# Patient Record
Sex: Female | Born: 1955 | Race: White | Hispanic: No | Marital: Married | Smoking: Never smoker
Health system: Southern US, Community
[De-identification: ages and names within clinical notes are randomized; demographics above are authoritative.]

## PROBLEM LIST (undated history)

## (undated) DIAGNOSIS — G4730 Sleep apnea, unspecified: Secondary | ICD-10-CM

## (undated) DIAGNOSIS — I10 Essential (primary) hypertension: Secondary | ICD-10-CM

## (undated) DIAGNOSIS — Z780 Asymptomatic menopausal state: Secondary | ICD-10-CM

## (undated) HISTORY — DX: Asymptomatic menopausal state: Z78.0

## (undated) HISTORY — PX: NO PAST SURGERIES: SHX2092

## (undated) HISTORY — DX: Sleep apnea, unspecified: G47.30

## (undated) HISTORY — DX: Essential (primary) hypertension: I10

---

## 2003-05-25 ENCOUNTER — Encounter: Payer: Self-pay | Admitting: Family Medicine

## 2005-04-04 ENCOUNTER — Encounter: Payer: Self-pay | Admitting: Family Medicine

## 2008-06-11 ENCOUNTER — Encounter: Payer: Self-pay | Admitting: Family Medicine

## 2008-06-25 ENCOUNTER — Encounter: Payer: Self-pay | Admitting: Family Medicine

## 2008-06-28 ENCOUNTER — Encounter: Payer: Self-pay | Admitting: Family Medicine

## 2009-08-23 LAB — CONVERTED CEMR LAB: Pap Smear: NORMAL

## 2009-09-02 ENCOUNTER — Encounter: Admission: RE | Admit: 2009-09-02 | Discharge: 2009-09-02 | Payer: Self-pay | Admitting: Obstetrics and Gynecology

## 2009-09-02 IMAGING — MG MM DIGITAL SCREENING
4 series · 4 of 4 positions shown · non-contrast
Comparison: Prior studies from [REDACTED] Assoc.

DG SCREEN MAMMOGRAM BILATERAL
Bilateral CC and MLO view(s) were taken.

DIGITAL SCREENING MAMMOGRAM WITH CAD:

[R CC]
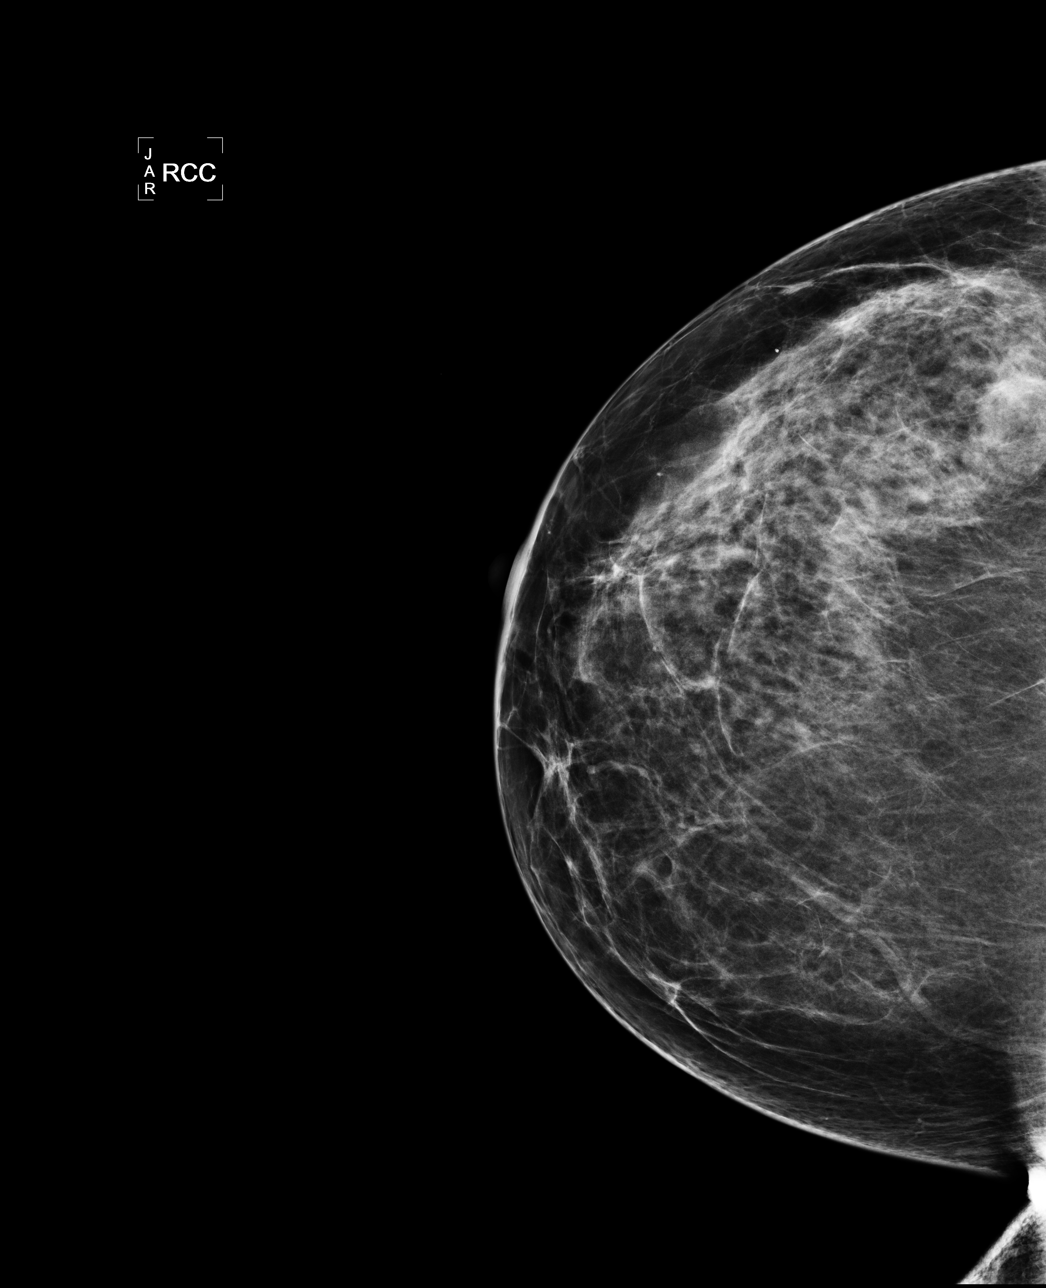

[L CC]
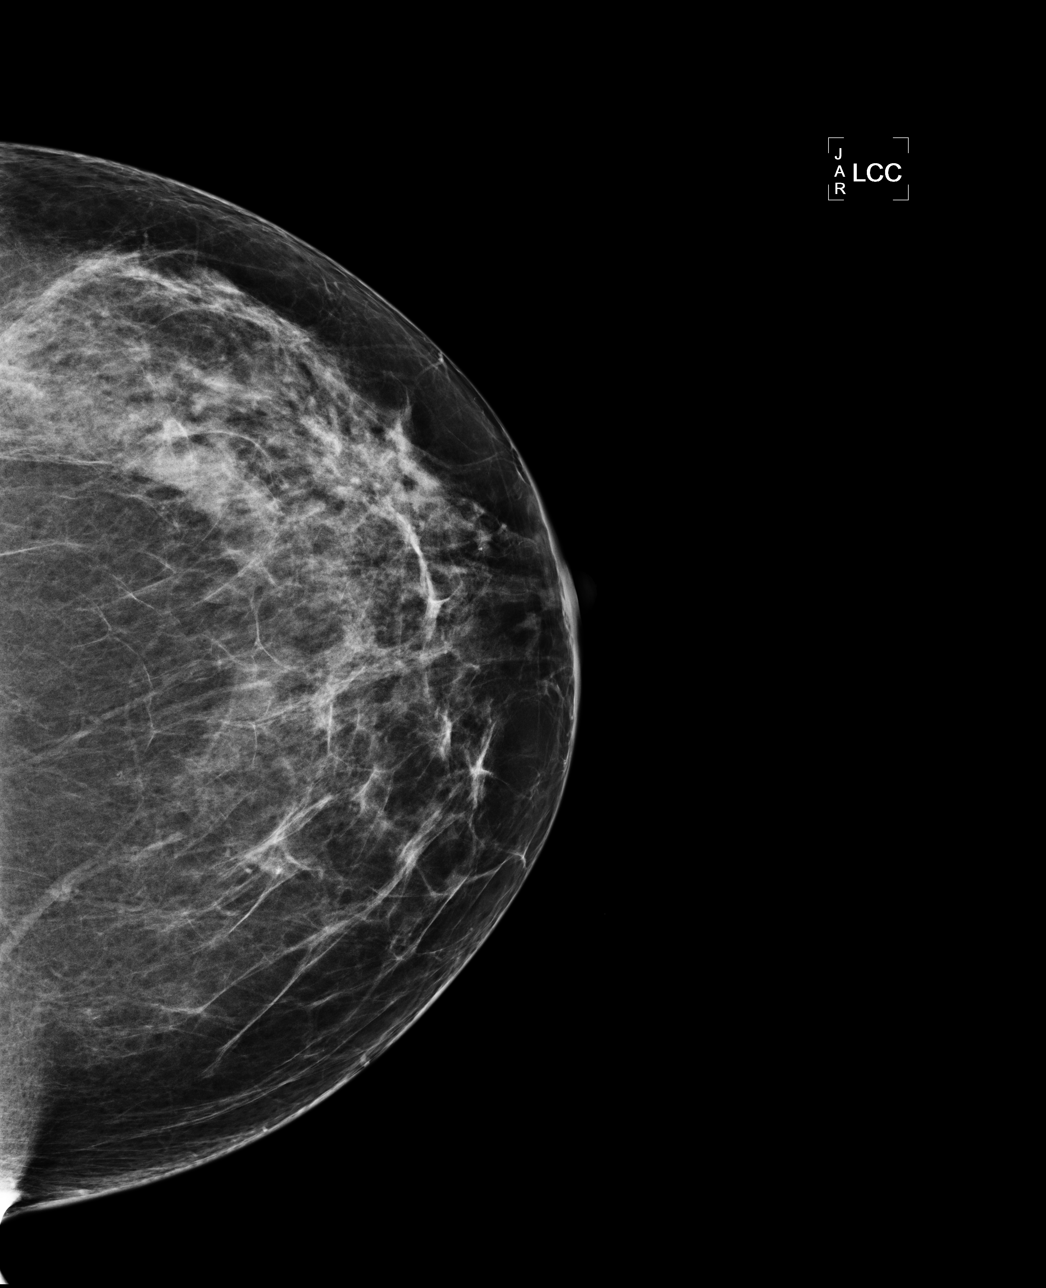

[L MLO]
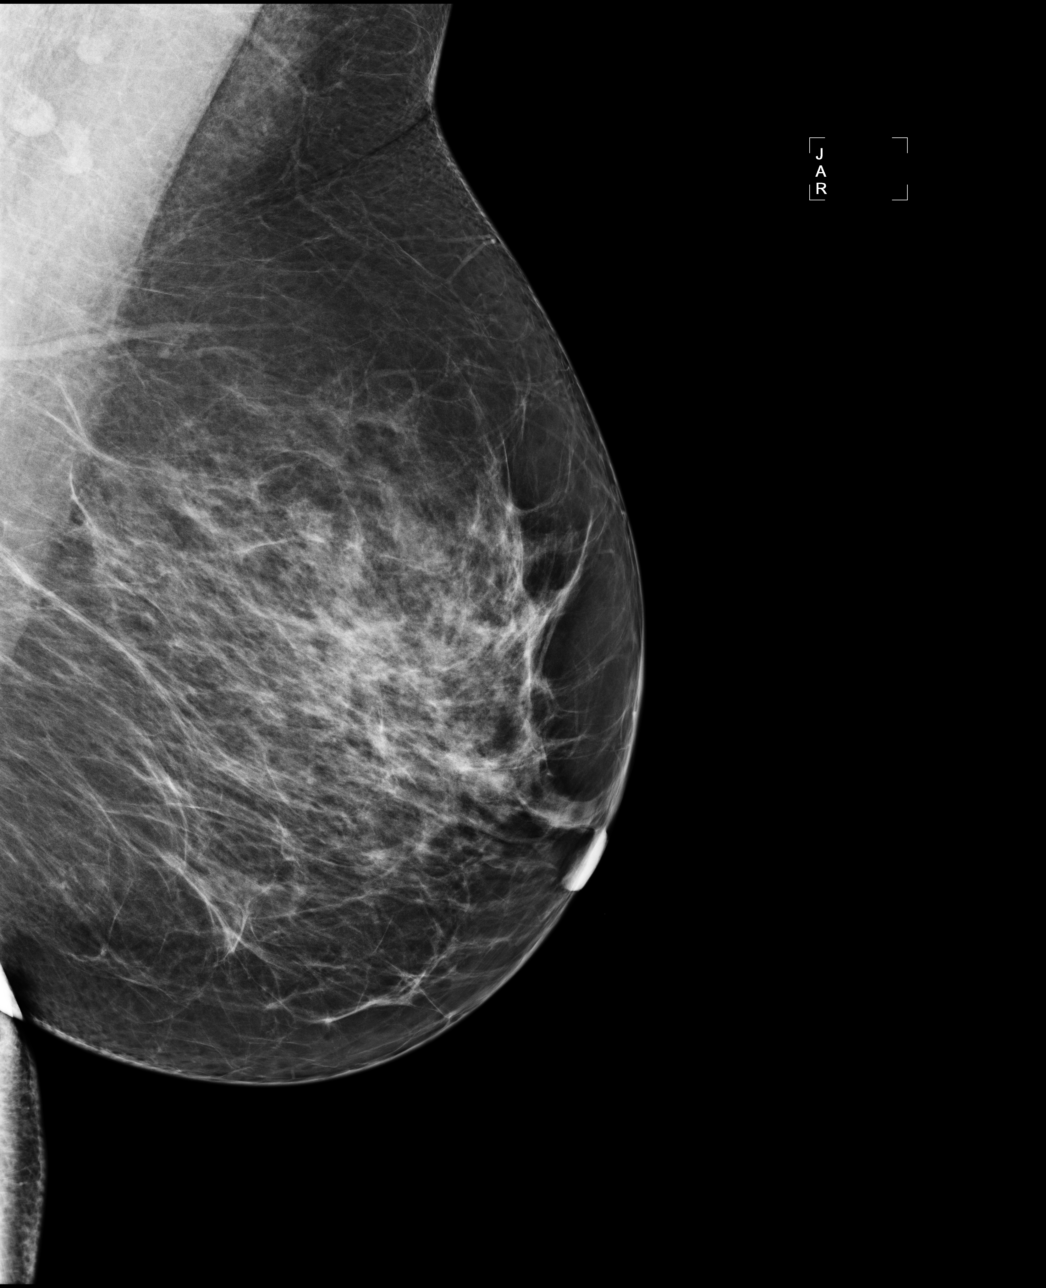

[R MLO]
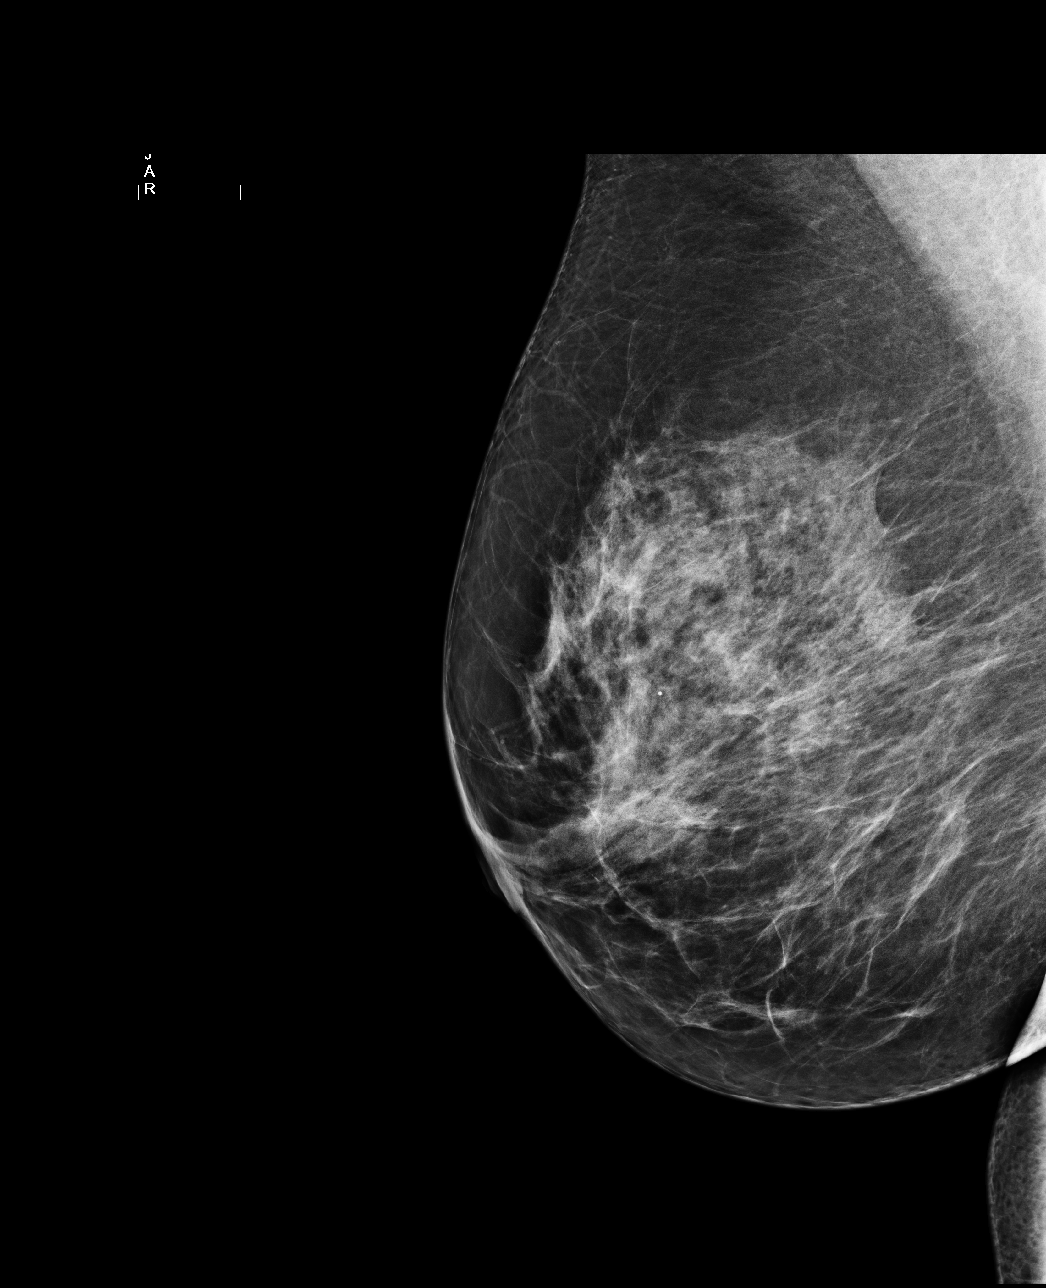

[4 of 4 positions shown; findings below may reference images not displayed]

[67] and [67].

The breast tissue is heterogeneously dense.  There is no dominant mass, architectural distortion or
calcification to suggest malignancy.

Images were processed with CAD.
IMPRESSION: No mammographic evidence of malignancy.  Suggest yearly screening mammography.

A result letter of this screening mammogram will be mailed directly to the patient.

ASSESSMENT: Negative - BI-RADS 1

Screening mammogram in 1 year.
,

## 2010-03-14 ENCOUNTER — Ambulatory Visit: Payer: Self-pay | Admitting: Family Medicine

## 2010-03-14 DIAGNOSIS — E559 Vitamin D deficiency, unspecified: Secondary | ICD-10-CM | POA: Insufficient documentation

## 2010-03-15 LAB — CONVERTED CEMR LAB
ALT: 20 units/L (ref 0–35)
AST: 24 units/L (ref 0–37)
Albumin: 4.3 g/dL (ref 3.5–5.2)
Alkaline Phosphatase: 74 units/L (ref 39–117)
BUN: 14 mg/dL (ref 6–23)
Basophils Absolute: 0.1 10*3/uL (ref 0.0–0.1)
Basophils Relative: 0.9 % (ref 0.0–3.0)
Bilirubin, Direct: 0.1 mg/dL (ref 0.0–0.3)
CO2: 29 meq/L (ref 19–32)
Calcium: 9.5 mg/dL (ref 8.4–10.5)
Chloride: 104 meq/L (ref 96–112)
Cholesterol: 225 mg/dL — ABNORMAL HIGH (ref 0–200)
Creatinine, Ser: 0.7 mg/dL (ref 0.4–1.2)
Direct LDL: 133.1 mg/dL
Eosinophils Absolute: 0.4 10*3/uL (ref 0.0–0.7)
Eosinophils Relative: 5.8 % — ABNORMAL HIGH (ref 0.0–5.0)
GFR calc non Af Amer: 91.81 mL/min (ref >60)
Glucose, Bld: 79 mg/dL (ref 70–99)
HCT: 37.4 % (ref 36.0–46.0)
HDL: 70 mg/dL (ref >39.00)
Hemoglobin: 12.7 g/dL (ref 12.0–15.0)
Lymphocytes Relative: 28.7 % (ref 12.0–46.0)
Lymphs Abs: 1.8 10*3/uL (ref 0.7–4.0)
MCHC: 34 g/dL (ref 30.0–36.0)
MCV: 82.2 fL (ref 78.0–100.0)
Monocytes Absolute: 0.5 10*3/uL (ref 0.1–1.0)
Monocytes Relative: 7.9 % (ref 3.0–12.0)
Neutro Abs: 3.6 10*3/uL (ref 1.4–7.7)
Neutrophils Relative %: 56.7 % (ref 43.0–77.0)
Platelets: 213 10*3/uL (ref 150.0–400.0)
Potassium: 4.1 meq/L (ref 3.5–5.1)
RBC: 4.55 M/uL (ref 3.87–5.11)
RDW: 15.7 % — ABNORMAL HIGH (ref 11.5–14.6)
Sodium: 143 meq/L (ref 135–145)
TSH: 1.23 microintl units/mL (ref 0.35–5.50)
Total Bilirubin: 0.9 mg/dL (ref 0.3–1.2)
Total CHOL/HDL Ratio: 3
Total Protein: 6.7 g/dL (ref 6.0–8.3)
Triglycerides: 129 mg/dL (ref 0.0–149.0)
VLDL: 25.8 mg/dL (ref 0.0–40.0)
Vit D, 25-Hydroxy: 44 ng/mL (ref 30–89)
WBC: 6.3 10*3/uL (ref 4.5–10.5)

## 2010-08-11 ENCOUNTER — Ambulatory Visit
Admission: RE | Admit: 2010-08-11 | Discharge: 2010-08-11 | Payer: Self-pay | Source: Home / Self Care | Attending: Family Medicine | Admitting: Family Medicine

## 2010-08-12 ENCOUNTER — Other Ambulatory Visit: Payer: Self-pay | Admitting: Family Medicine

## 2010-08-12 DIAGNOSIS — Z1239 Encounter for other screening for malignant neoplasm of breast: Secondary | ICD-10-CM

## 2010-08-22 NOTE — Letter (Signed)
Summary: Patient Questionnaire  Patient Questionnaire   Imported By: Beau Fanny 03/14/2010 15:12:13  _____________________________________________________________________  External Attachment:    Type:   Image     Comment:   External Document

## 2010-08-22 NOTE — Assessment & Plan Note (Signed)
Summary: NEW PT TO EST/CLE   Vital Signs:  Patient profile:   55 year old female Height:      67 inches Weight:      194 pounds BMI:     30.49 Temp:     98.7 degrees F oral Pulse rate:   80 / minute Pulse rhythm:   regular BP sitting:   146 / 98  (left arm) Cuff size:   regular  Vitals Entered By: Lewanda Rife LPN (March 14, 2010 11:20 AM) CC: New pt to establish   History of Present Illness: [place]   [place] here from [place] -- and really likes it  lives in [place]    bp high today old records indicate HTN in the past - per pt borderline  [facility]/ 118/82 in spring  this am - was crazy -- rushed here -- thinks that is why  old records indicate high chol- diet controlled  had imp in 2009-- no check since then   old records indicate vit D def  takes her D with ca - ? how many units a day    mother had breast cancer   last mam was jan 11-- was good  has never had any abn mammograms   biggest issue is weight -- trouble loosing  has done wt watchers before -- did not know well  does eat out a lot on the weekends   Td shot is over 10 y ago   heel pain -- worse in am -- better after streching  has to wear shoes  aleve helps       Preventive Screening-Counseling & Management  Alcohol-Tobacco     Smoking Status: never  Caffeine-Diet-Exercise     Does Patient Exercise: yes      Drug Use:  no.    Allergies (verified): No Known Drug Allergies  Past History:  Past Medical History: HTN- labile  hyperlipidemia  vit D def  family hx of heart disease   Past Surgical History: Colonoscopy 06/2008 nuclear stress test 9/06 normal   Family History: Father: Living arthritis, elevated cholesterol,high blood pressure,             diabetes Mother: Living arthritis, breast cancer,elevated cholesterol,              stroke,high blood pressure and diabetes Siblings:brother Living elevated  cholesterol, heart diesease,high               blood pressure                sister:Living High  blood pressure no other cancer in family   Social History: Marital Status: Married Occupation: Education officer, 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 Eyes:  Denies blurring and eye pain. CV:  Denies chest pain or discomfort, lightheadness, palpitations, and shortness of breath with exertion. Resp:  Denies cough, shortness of breath, and wheezing. GI:  Denies abdominal pain, change in bowel habits, indigestion, and nausea. GU:  Denies dysuria and urinary frequency. MS:  Complains of joint  pain; denies muscle aches, cramps, and muscle weakness. Derm:  Denies lesion(s), poor wound healing, and rash. Neuro:  Denies numbness and tingling. Psych:  Denies anxiety and depression. Endo:  Denies cold intolerance, excessive thirst, excessive urination, and heat intolerance. Heme:  Denies abnormal bruising and bleeding.  Physical Exam  General:  overweight but generally well appearing  Head:  normocephalic, atraumatic, and no abnormalities observed.   Eyes:  vision grossly intact, pupils equal, pupils round, and pupils reactive to light.   Ears:  R ear normal and L ear normal.   Mouth:  pharynx pink and moist.   Neck:  supple with full rom and no masses or thyromegally, no JVD or carotid bruit  Chest Wall:  No deformities, masses, or tenderness noted. Lungs:  Normal respiratory effort, chest expands symmetrically. Lungs are clear to auscultation, no crackles or wheezes. Heart:  Normal rate and regular rhythm. S1 and S2 normal without gallop, murmur, click, rub or other  extra sounds. Abdomen:  Bowel sounds positive,abdomen soft and non-tender without masses, organomegaly or hernias noted. no renal bruits  Msk:  no tenderness over feet or heels no achilles tenderness  Pulses:  R and L carotid,radial,femoral,dorsalis pedis and posterior tibial pulses are full and equal bilaterally Extremities:  No clubbing, cyanosis, edema, or deformity noted with normal full range of motion of all joints.   Neurologic:  sensation intact to light touch, gait normal, and DTRs symmetrical and normal.   Skin:  Intact without suspicious lesions or rashes Cervical Nodes:  No lymphadenopathy noted Inguinal Nodes:  No significant adenopathy Psych:  normal affect, talkative and pleasant    Impression & Recommendations:  Problem # 1:  ELEVATED BLOOD PRESSURE WITHOUT DIAGNOSIS OF HYPERTENSION (ICD-796.2) Assessment New with elevated bp today and hx of it -- [facility] and [facility] will re check at f/u  pt is rushed today disc avoidance of caff and sodium urged to keep up the exercise if not imp at next visit will disc tx lab today as well  Problem # 2:  UNSPECIFIED VITAMIN D DEFICIENCY (ICD-268.9) Assessment: New hx of vit D def with last level 21 in 2009 pt did not inc her vit D takes ca and D combo -- ? how much check level today and disc at f/u in 1 mo  Orders: T-Vitamin D (25-Hydroxy) (60454-09811)  Problem # 3:  Preventive Health Care (ICD-V70.0) pt is interested in prevention and health mt  lab today f/u for PE in 1 mo   Problem # 4:  PLANTAR FASCIITIS (ICD-728.71) Assessment: New with heel pain in ams that imp with activity disc stretching/ ice / andhard soled shoe for consv tx  if not imp at f/u -- will disc plan for ref   Complete Medication List: 1)  Multivitamins Tabs (Multiple vitamin) .... Take 1 tablet by mouth once a day 2)  Fish Oil Oil (Fish oil) .... 1000mg  daily 3)  Calcium-vitamin D 500-200 Mg-unit Tabs (Calcium  carbonate-vitamin d) .... Take one tablet by mouth twice a day.  Other Orders: Venipuncture (91478) TLB-Lipid Panel (80061-LIPID) TLB-BMP (Basic Metabolic Panel-BMET) (80048-METABOL) TLB-CBC Platelet - w/Differential (85025-CBCD) TLB-Hepatic/Liver Function Pnl (80076-HEPATIC) TLB-TSH (Thyroid Stimulating Hormone) (84443-TSH) TD Toxoids IM 7 YR + (29562) Admin 1st Vaccine (13086)  Patient Instructions: 1)  avoid salt and salty foods for your blood pressure  2)  drink lots of water 3)  keep up good exercise 4)  we will re check bp at next  visit  5)  wellness labs today 6)  tetnus shot today  7)  wear hard soled shoe -- do not go barefoot -- even keep shoes by the bed  8)  try ice (in the form of a frozen can )-- try 5 minutes twice daily  9)  name of problem is plantar fasciitis  10)  schedule PE -- in about a month (any 30 min visit is fine)   Current Allergies (reviewed today): No known allergies    Preventive Care Screening  Mammogram:    Date:  06/15/2008    Results:  normal   Colonoscopy:    Date:  06/25/2008    Results:  normal    Immunizations Administered:  Tetanus Vaccine:    Vaccine Type: Td    Site: left deltoid    Mfr: Sanofi Pasteur    Dose: 0.5 ml    Route: IM    Given by: Lewanda Rife LPN    Exp. Date: 08/24/2011    Lot #: [id number]    VIS given: 06/10/07 version given March 14, 2010.

## 2010-08-22 NOTE — Procedures (Signed)
Summary: Colonoscopy/[facility]  Colonoscopy/[facility]   Imported By: Lanelle Bal 03/20/2010 11:31:35  _____________________________________________________________________  External Attachment:    Type:   Image     Comment:   External Document

## 2010-08-24 NOTE — Assessment & Plan Note (Signed)
Summary: CPX / LFW   Vital Signs:  Patient profile:   55 year old female Height:      67 inches Weight:      183.75 pounds BMI:     28.88 Temp:     98.3 degrees F oral Pulse rate:   84 / minute Pulse rhythm:   regular BP sitting:   136 / 82  (left arm) Cuff size:   regular  Vitals Entered By: Selena Batten Dance CMA Duncan Dull) (August 11, 2010 2:33 PM) CC: CPx   History of Present Illness: here for health mt exam and to review chronic medical problems   is feeling good   wt is down 11 lb-- working hard with diet and exercise  working with [employer]    HTN -- bp 136/82 today -- tends to be labile  exercise has helped too   mam 1/11 needs a referral for that  no lumps on self exam  mother had breast ca times 2  gyn care --next month -- at [facility] 11-- was last nl pap   colonosc 12/09 nl - no family hx   Td 8/11 does usually not get flu shots but may reconsider because of flu shot   lipids are fair with trig 129 , HDL 70 and LDL 133-- last time LDL 116  is probably lower now   D level is 44 (low in the past )--last check was 21  is menopausal     Allergies (verified): No Known Drug Allergies  Past History:  Past Surgical History: Last updated: 03/14/2010 Colonoscopy 06/2008 nuclear stress test 9/06 normal   Family History: Last updated: 03/14/2010 Father: Living arthritis, elevated cholesterol,high blood pressure,             diabetes Mother: Living arthritis, breast cancer,elevated cholesterol,              stroke,high blood pressure and diabetes Siblings:brother Living elevated cholesterol, heart diesease,high               blood pressure                sister:Living High  blood pressure no other cancer in family   Social History: Last updated: 03/14/2010 Marital Status: Married Occupation: Education officer, 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 Eyes:  Denies blurring and eye irritation. CV:  Denies chest pain or discomfort and lightheadness. Resp:  Denies cough, shortness of breath, and wheezing. GI:  Denies abdominal pain, change in bowel habits, and indigestion. GU:  Denies dysuria and urinary frequency. MS:  Denies joint pain, joint redness, and joint swelling. Derm:  Denies itching, lesion(s), poor wound healing, and rash. Neuro:  Denies numbness and tingling. Psych:  Denies anxiety and depression. Endo:  Denies excessive thirst and excessive urination. Heme:  Denies abnormal bruising and bleeding.  Physical Exam  General:  overweight but generally well appearing  Head:  normocephalic, atraumatic, and no  abnormalities observed.   Eyes:  vision grossly intact, pupils equal, pupils round, and pupils reactive to light.   Ears:  R ear normal and L ear normal.   Nose:  no nasal discharge.   Mouth:  pharynx pink and moist.   Neck:  supple with full rom and no masses or thyromegally, no JVD or carotid bruit  Chest Wall:  No deformities, masses, or tenderness noted. Lungs:  Normal respiratory effort, chest expands symmetrically. Lungs are clear to auscultation, no crackles or wheezes. Heart:  Normal rate and regular rhythm. S1 and S2 normal without gallop, murmur, click, rub or other extra sounds. Abdomen:  Bowel sounds positive,abdomen soft and non-tender without masses, organomegaly or hernias noted. no renal bruits  Msk:  No deformity or scoliosis noted of thoracic or lumbar spine.  no acute joint changes   Pulses:  R and L carotid,radial,femoral,dorsalis pedis and posterior tibial pulses are full and equal bilaterally Extremities:  No clubbing, cyanosis, edema, or deformity noted with normal full range of motion of all joints.   Neurologic:  sensation intact to light touch, gait normal, and DTRs symmetrical and normal.   Skin:  Intact without suspicious lesions or rashes lentigos diffusely  Cervical Nodes:  No lymphadenopathy noted Inguinal Nodes:  No significant adenopathy Psych:  normal affect, talkative and pleasant    Impression & Recommendations:  Problem # 1:  HEALTH MAINTENANCE EXAM (ICD-V70.0) Assessment Comment Only reviewed health habits including diet, exercise and skin cancer prevention reviewed health maintenance list and family history commended good work with diet and exercise and wt loss  reviewed labs from summer in detail   Problem # 2:  UNSPECIFIED VITAMIN D DEFICIENCY (ICD-268.9) Assessment: Improved this is improved with ca and D otc will continue current dose pt will check with ins to see if a dexa would be covered at this age and let me know   Problem # 3:  OTHER SCREENING MAMMOGRAM (ICD-V76.12) Assessment: Comment Only will schedule yearly mam enc self exams  upcoming gyn visit for breast exam with Dr Doristine Church Orders: Radiology Referral (Radiology)  Complete Medication List: 1)  Multivitamins Tabs (Multiple vitamin) .... Take 1 tablet by mouth once a day 2)  Fish Oil Oil (Fish oil) .... 1000mg  daily 3)  Calcium-vitamin D 500-200 Mg-unit Tabs (Calcium carbonate-vitamin d) .... Take one tablet by mouth twice a day.  Patient Instructions: 1)  please call your insurance and ask them if a bone density test would be covered in light of your prevoius vitamin d deficiency  2)  let me know  3)  we will schedule mammogram at check out  4)  keep up the good work with diet and exercise    Orders Added: 1)  Radiology Referral [Radiology] 2)  Est. Patient 40-64  years [04540]    Current Allergies (reviewed today): No known allergies    Preventive Care Screening  Pap Smear:    Date:  08/23/2009    Results:  normal

## 2010-09-11 ENCOUNTER — Ambulatory Visit: Payer: Self-pay | Admitting: Family Medicine

## 2010-09-15 ENCOUNTER — Ambulatory Visit: Payer: Self-pay

## 2010-09-29 ENCOUNTER — Ambulatory Visit
Admission: RE | Admit: 2010-09-29 | Discharge: 2010-09-29 | Disposition: A | Discharge status: Home / Self Care | Payer: BC Managed Care – PPO | Source: Ambulatory Visit | Attending: Family Medicine | Admitting: Family Medicine

## 2010-09-29 DIAGNOSIS — Z1239 Encounter for other screening for malignant neoplasm of breast: Secondary | ICD-10-CM

## 2010-09-29 IMAGING — MG MM SCREEN MAMMOGRAM BILATERAL
4 series · 4 of 4 positions shown · non-contrast
Comparison: none

DG SCREEN MAMMOGRAM BILATERAL
Bilateral CC and MLO view(s) were taken.

DIGITAL SCREENING MAMMOGRAM WITH CAD:
There are scattered fibroglandular densities.  No masses or malignant type calcifications are 
identified.  Compared with prior studies.
Images were processed with CAD.

[R CC]
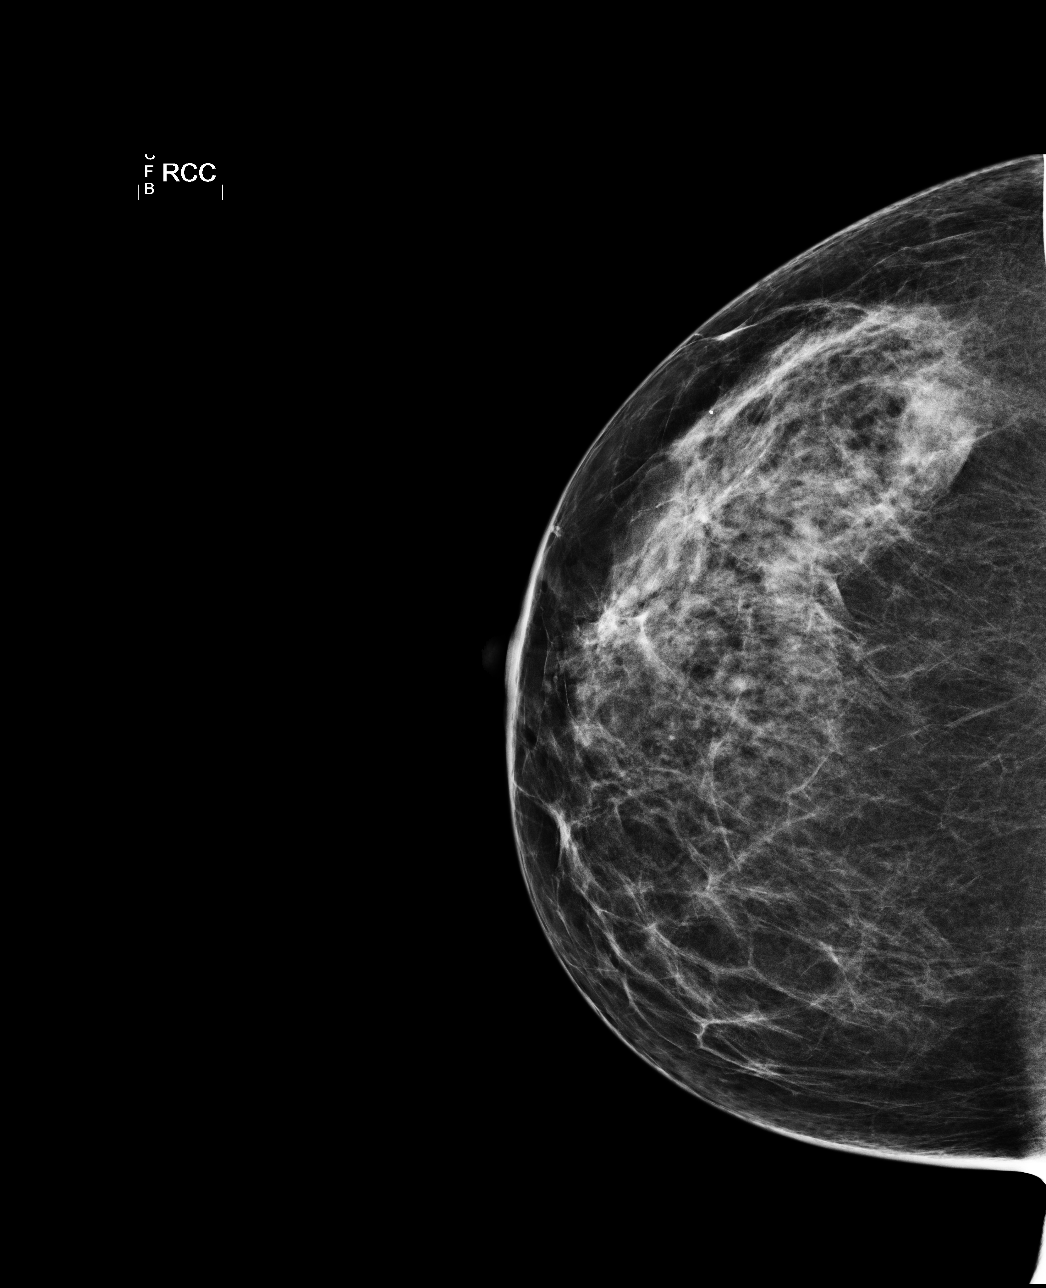

[L CC]
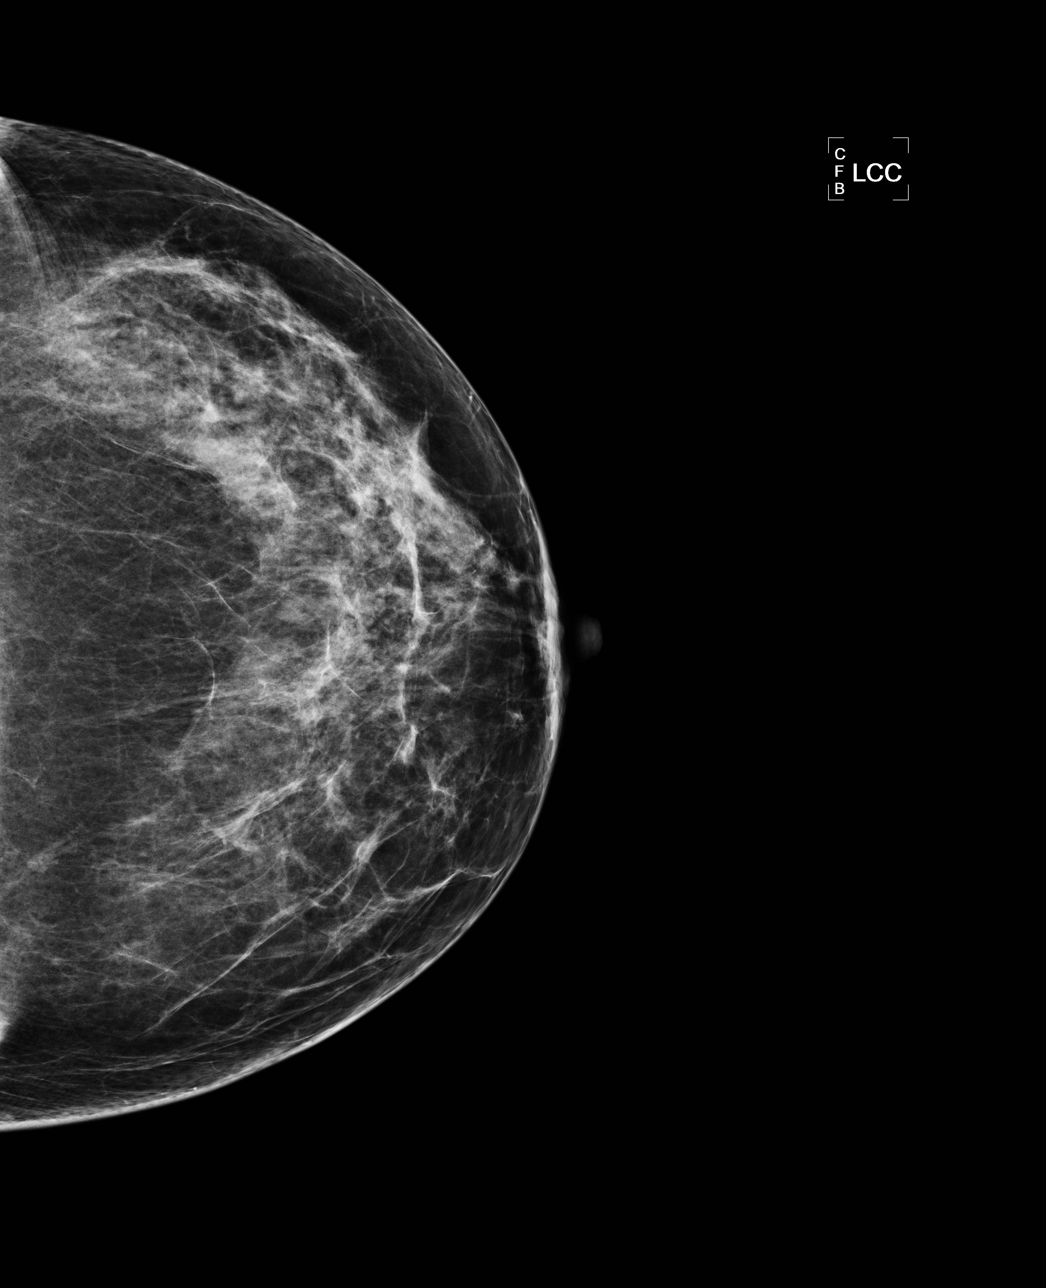

[L MLO]
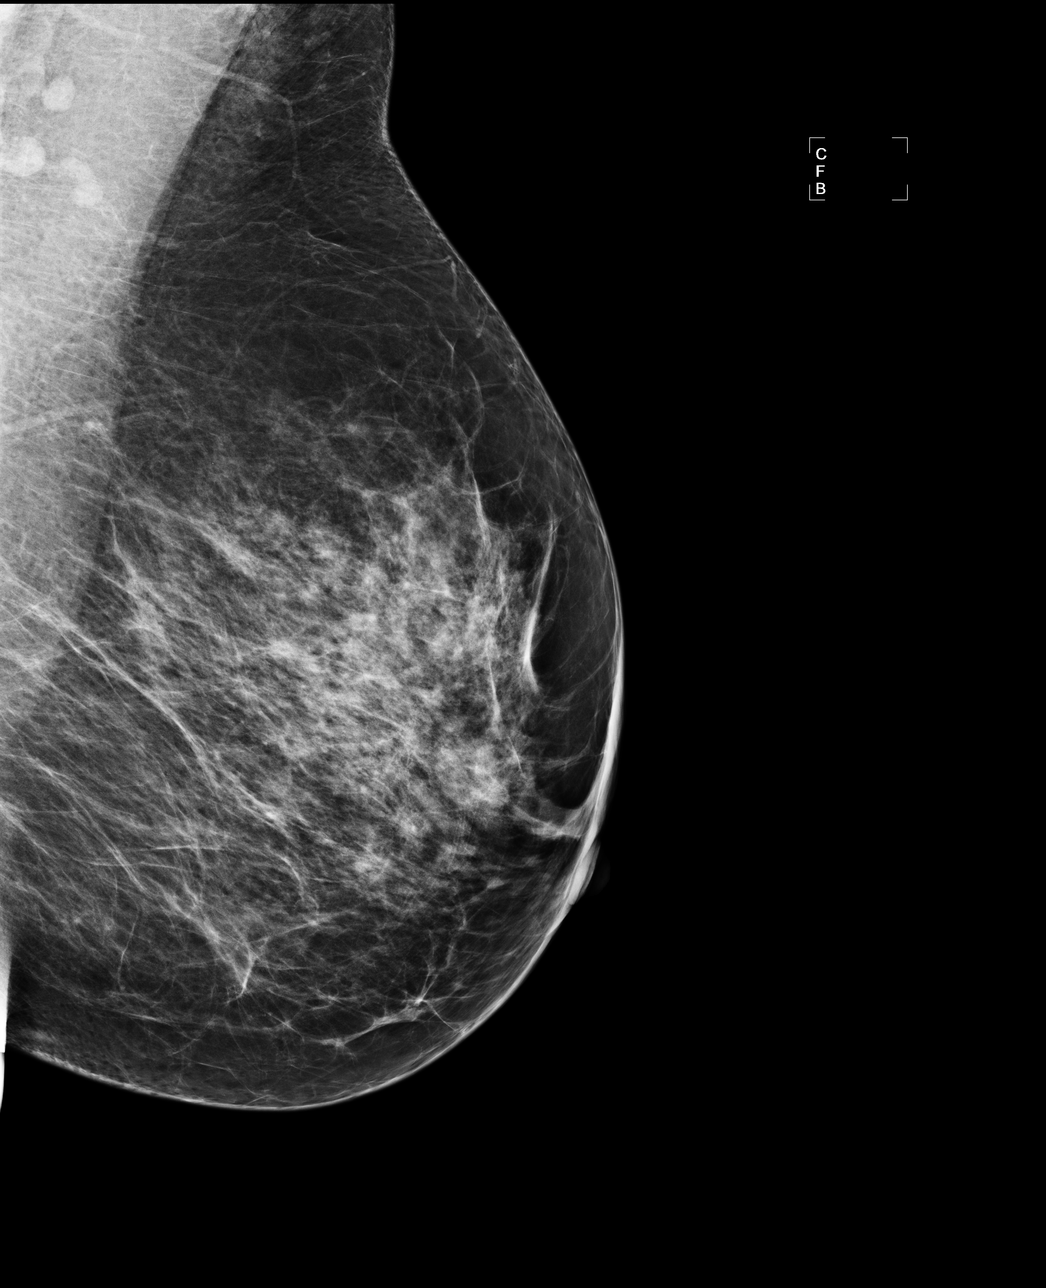

[R MLO]
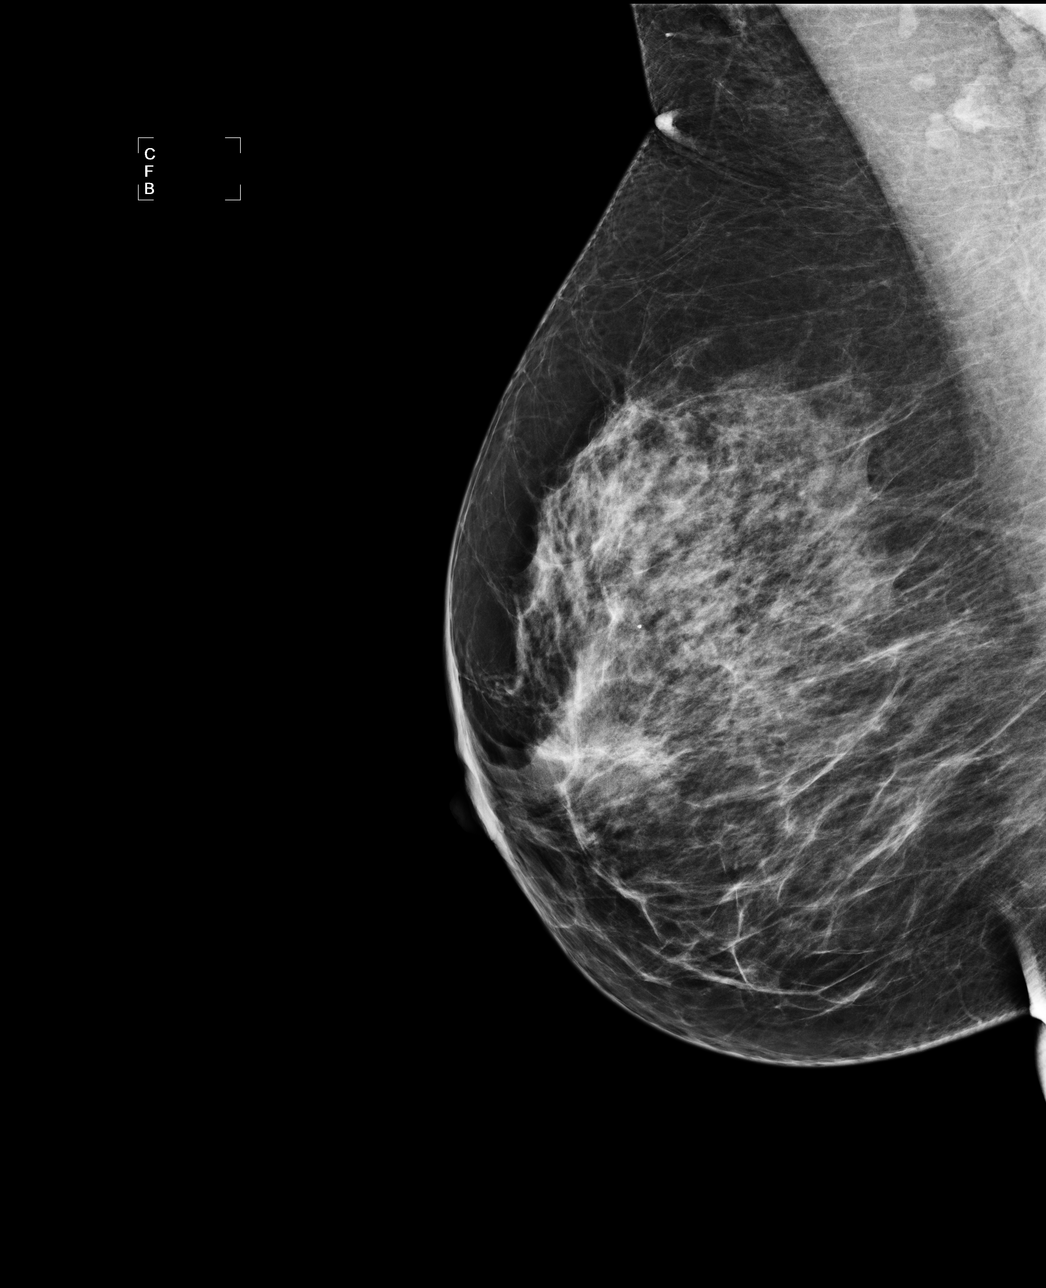

[4 of 4 positions shown; findings below may reference images not displayed]

IMPRESSION: No specific mammographic evidence of malignancy.  Next screening mammogram is recommended in one 
year.

A result letter of this screening mammogram will be mailed directly to the patient.

ASSESSMENT: Negative - BI-RADS 1

Screening mammogram in 1 year.
,

## 2010-10-06 ENCOUNTER — Encounter (INDEPENDENT_AMBULATORY_CARE_PROVIDER_SITE_OTHER): Payer: Self-pay | Admitting: *Deleted

## 2010-10-10 NOTE — Letter (Signed)
Summary: Results Follow up Letter  [facility]  [address] [place], [place] [address]   Phone: [phone]  Fax: [phone]    10/06/2010 MRN: [id number]     [facility] [address] [place], [place]  [address]    Dear Ms. Greif,  The following are the results of your recent test(s):  Test         Result    Pap Smear:        Normal _____  Not Normal _____ Comments: ______________________________________________________ Cholesterol: LDL(Bad cholesterol):         Your goal is less than:         HDL (Good cholesterol):       Your goal is more than: Comments:  ______________________________________________________ Mammogram:        Normal __X___  Not Normal _____ Comments:Repeat in 1 year  ___________________________________________________________________ Hemoccult:        Normal _____  Not normal _______ Comments:    _____________________________________________________________________ Other Tests:    We routinely do not discuss normal results over the telephone.  If you desire a copy of the results, or you have any questions about this information we can discuss them at your next office visit.   Sincerely,  Roxy Manns MD

## 2011-12-10 ENCOUNTER — Other Ambulatory Visit: Payer: Self-pay | Admitting: Obstetrics and Gynecology

## 2011-12-10 DIAGNOSIS — Z1231 Encounter for screening mammogram for malignant neoplasm of breast: Secondary | ICD-10-CM

## 2011-12-10 DIAGNOSIS — Z803 Family history of malignant neoplasm of breast: Secondary | ICD-10-CM

## 2011-12-20 ENCOUNTER — Telehealth: Payer: Self-pay | Admitting: Family Medicine

## 2011-12-20 DIAGNOSIS — E559 Vitamin D deficiency, unspecified: Secondary | ICD-10-CM

## 2011-12-20 DIAGNOSIS — Z0000 Encounter for general adult medical examination without abnormal findings: Secondary | ICD-10-CM

## 2011-12-20 NOTE — Telephone Encounter (Signed)
Message copied by Judy Pimple on Thu Dec 20, 2011  7:01 PM ------      Message from: Baldomero Lamy      Created: Wed Dec 12, 2011 10:18 AM      Regarding: Cpx labsFri 5/31       Please order  future cpx labs for pt's upcomming lab appt.      Thanks      Rodney Booze

## 2011-12-21 ENCOUNTER — Other Ambulatory Visit: Payer: BC Managed Care – PPO

## 2011-12-26 ENCOUNTER — Encounter: Payer: BC Managed Care – PPO | Admitting: Family Medicine

## 2012-01-09 ENCOUNTER — Other Ambulatory Visit: Payer: BC Managed Care – PPO

## 2012-03-26 ENCOUNTER — Ambulatory Visit: Payer: BC Managed Care – PPO

## 2012-04-08 ENCOUNTER — Ambulatory Visit (INDEPENDENT_AMBULATORY_CARE_PROVIDER_SITE_OTHER): Payer: BC Managed Care – PPO | Admitting: Obstetrics & Gynecology

## 2012-04-08 ENCOUNTER — Encounter: Payer: Self-pay | Admitting: Obstetrics & Gynecology

## 2012-04-08 ENCOUNTER — Encounter: Payer: BC Managed Care – PPO | Admitting: Obstetrics & Gynecology

## 2012-04-08 VITALS — BP 140/100 | HR 85 | Ht 68.0 in | Wt 189.0 lb

## 2012-04-08 DIAGNOSIS — Z803 Family history of malignant neoplasm of breast: Secondary | ICD-10-CM

## 2012-04-08 DIAGNOSIS — Z1239 Encounter for other screening for malignant neoplasm of breast: Secondary | ICD-10-CM

## 2012-04-08 NOTE — Patient Instructions (Signed)
Return to clinic for any scheduled appointments or for any gynecologic concerns as needed.   

## 2012-04-08 NOTE — Progress Notes (Signed)
History:  56 y.o. No obstetric history on file. here today for [facility] Breast Testing.  Had mammogram 2 months ago that was normal, also normal pap 3 months ago. PCP follows other healthcare maintenance.    The following portions of the patient's history were reviewed and updated as appropriate: allergies, current medications, past family history, past medical history, past social history, past surgical history and problem list.  Review of Systems:  Pertinent items are noted in HPI.  Objective:  Physical Exam Blood pressure 140/100, pulse 85, height 5\' 8"  (1.727 m), weight 189 lb (85.73 kg). Gen: NAD Breast: Normal bilaterally. No masses, skin changes, lymphadenopathy, nipple drainage observed. Forcyte specimens collected as per instructions.   Assessment & Plan:  Will follow up Forcyte results Routine preventative health maintenance measures emphasized  Total encounter time: 30 minutes

## 2012-04-08 NOTE — Progress Notes (Signed)
Patient is here today for Forcyte testing due to family history.

## 2012-05-01 ENCOUNTER — Ambulatory Visit: Payer: BC Managed Care – PPO

## 2012-05-23 ENCOUNTER — Encounter: Payer: BC Managed Care – PPO | Admitting: Family Medicine

## 2012-05-27 ENCOUNTER — Encounter: Payer: BC Managed Care – PPO | Admitting: Family Medicine

## 2012-06-02 ENCOUNTER — Other Ambulatory Visit: Payer: BC Managed Care – PPO

## 2012-06-06 ENCOUNTER — Encounter: Payer: Self-pay | Admitting: Family Medicine

## 2012-06-06 ENCOUNTER — Ambulatory Visit (INDEPENDENT_AMBULATORY_CARE_PROVIDER_SITE_OTHER): Payer: BC Managed Care – PPO | Admitting: Family Medicine

## 2012-06-06 VITALS — BP 136/98 | HR 80 | Temp 98.4°F | Ht 66.75 in | Wt 190.2 lb

## 2012-06-06 DIAGNOSIS — Z0000 Encounter for general adult medical examination without abnormal findings: Secondary | ICD-10-CM

## 2012-06-06 DIAGNOSIS — E559 Vitamin D deficiency, unspecified: Secondary | ICD-10-CM

## 2012-06-06 LAB — COMPREHENSIVE METABOLIC PANEL
ALT: 14 U/L (ref 0–35)
AST: 15 U/L (ref 0–37)
Albumin: 4.8 g/dL (ref 3.5–5.2)
Alkaline Phosphatase: 84 U/L (ref 39–117)
Chloride: 102 mEq/L (ref 96–112)
Potassium: 4.3 mEq/L (ref 3.5–5.3)
Sodium: 139 mEq/L (ref 135–145)
Total Protein: 6.9 g/dL (ref 6.0–8.3)

## 2012-06-06 LAB — CBC WITH DIFFERENTIAL/PLATELET
Basophils Absolute: 0.1 10*3/uL (ref 0.0–0.1)
Basophils Relative: 1 % (ref 0–1)
Hemoglobin: 13 g/dL (ref 12.0–15.0)
Lymphocytes Relative: 21 % (ref 12–46)
MCHC: 32.9 g/dL (ref 30.0–36.0)
Monocytes Relative: 7 % (ref 3–12)
Neutro Abs: 7.5 10*3/uL (ref 1.7–7.7)
Neutrophils Relative %: 66 % (ref 43–77)
RDW: 13.7 % (ref 11.5–15.5)
WBC: 11.2 10*3/uL — ABNORMAL HIGH (ref 4.0–10.5)

## 2012-06-06 LAB — LIPID PANEL
LDL Cholesterol: 106 mg/dL — ABNORMAL HIGH (ref 0–99)
VLDL: 30 mg/dL (ref 0–40)

## 2012-06-06 NOTE — Progress Notes (Signed)
Subjective:    Patient ID: Deborah Orozco, female    DOB: [birth year], 56 y.o.   MRN: [id number]  HPI Here for health maintenance exam and to review chronic medical problems    Wt is up 1 lb with bmi of 30  Getting over a cold with st and cough - now sinus congestion  Some pressure  No fever  Green/ yellow discharge  Is 7 days in   Hx of D def   Needs labs - did not eat lunch Ate cheerios    Had breast check recently- genetic ? - to look for pre cancerous cells at cell level- something new  It is called a forcyte test  No results yet  mammo 3/12, then again 01/04/12 Mother had breast and colon cancer- died in May 29, 2023   colonosc was 06/25/08 normal- with 5 year recall for fam hx   Flu vaccine- had her flu vaccine - May 29, 2023  Gyn-exam was recent and [facility]  Uses vagifem   Grief has been rough - but getting through it   bp is up today - has not been eating right   Patient Active Problem List  Diagnosis  . UNSPECIFIED VITAMIN D DEFICIENCY  . Routine general medical examination at a health care facility  . Breast screening, unspecified   Past Medical History  Diagnosis Date  . Post-menopausal    No past surgical history on file. History  Substance Use Topics  . Smoking status: Never Smoker   . Smokeless tobacco: Not on file  . Alcohol Use: Yes     Comment: social   Family History  Problem Relation Age of Onset  . Cancer Mother 75    Breast twice recurred at 46/colon/liver  . Diabetes Mother   . Hypertension Mother   . Hypertension Father   . Heart disease Brother   . Cancer Maternal Grandfather     possible colon cancer unsure of exact location gastro/tn   No Known Allergies No current outpatient prescriptions on file prior to visit.      Review of Systems Review of Systems  Constitutional: Negative for fever, appetite change, fatigue and unexpected weight change.  Eyes: Negative for pain and visual disturbance.  ENT pos  for sinus congestion, neg for ST Respiratory: Negative for cough and shortness of breath.   Cardiovascular: Negative for cp or palpitations    Gastrointestinal: Negative for nausea, diarrhea and constipation.  Genitourinary: Negative for urgency and frequency.  Skin: Negative for pallor or rash   Neurological: Negative for weakness, light-headedness, numbness and headaches.  Hematological: Negative for adenopathy. Does not bruise/bleed easily.  Psychiatric/Behavioral: Negative for dysphoric mood. The patient is not nervous/anxious.         Objective:   Physical Exam  Constitutional: She appears well-developed and well-nourished. No distress.       overwt and well appearing  HENT:  Head: Normocephalic and atraumatic.  Right Ear: External ear normal.  Left Ear: External ear normal.  Mouth/Throat: Oropharynx is clear and moist. No oropharyngeal exudate.       Nares are injected and congested  No sinus tenderness  Eyes: Conjunctivae normal and EOM are normal. Pupils are equal, round, and reactive to light. Right eye exhibits no discharge. Left eye exhibits no discharge. No scleral icterus.  Neck: Normal range of motion. Neck supple. No JVD present. Carotid bruit is not present. No thyromegaly present.  Cardiovascular: Normal rate, regular rhythm, normal heart sounds and intact  distal pulses.  Exam reveals no gallop.   Pulmonary/Chest: Effort normal and breath sounds normal. No respiratory distress. She has no wheezes. She has no rales.  Abdominal: Soft. Bowel sounds are normal. She exhibits no distension, no abdominal bruit and no mass. There is no tenderness.  Musculoskeletal: She exhibits no edema and no tenderness.  Lymphadenopathy:    She has no cervical adenopathy.  Neurological: She is alert. She has normal reflexes. No cranial nerve deficit. Coordination normal.  Skin: Skin is warm and dry. No rash noted. No erythema. No pallor.  Psychiatric: She has a normal mood and affect.           Assessment & Plan:

## 2012-06-06 NOTE — Patient Instructions (Addendum)
Try mucinex DM for congestion  Follow up with me in about 2 months to check bp again  You will be due for a  5 year colonoscopy in 12/14 Labs today  Gradually get back to exercise when you are ready

## 2012-06-06 NOTE — Assessment & Plan Note (Signed)
Reviewed health habits including diet and exercise and skin cancer prevention Also reviewed health mt list, fam hx and immunizations  Wellness labs today  bp up a bit but took pseudoephedrine - so f/u 2 mo re check  Will get back to exercise

## 2012-06-07 LAB — VITAMIN D 25 HYDROXY (VIT D DEFICIENCY, FRACTURES): Vit D, 25-Hydroxy: 35 ng/mL (ref 30–89)

## 2012-06-08 NOTE — Assessment & Plan Note (Signed)
Lab today - is taking supplement Disc imp to bone and overall health

## 2012-06-09 ENCOUNTER — Encounter: Payer: Self-pay | Admitting: *Deleted

## 2012-06-10 ENCOUNTER — Telehealth: Payer: Self-pay | Admitting: *Deleted

## 2012-06-10 NOTE — Telephone Encounter (Signed)
Spoke to patient regarding her Forcyte testing.  Her test was negative and her results will be scanned into the system.

## 2012-06-23 ENCOUNTER — Other Ambulatory Visit: Payer: Self-pay | Admitting: Family Medicine

## 2012-06-23 DIAGNOSIS — D72829 Elevated white blood cell count, unspecified: Secondary | ICD-10-CM

## 2012-06-27 ENCOUNTER — Other Ambulatory Visit: Payer: BC Managed Care – PPO

## 2012-07-02 ENCOUNTER — Other Ambulatory Visit: Payer: BC Managed Care – PPO

## 2012-07-02 ENCOUNTER — Other Ambulatory Visit (INDEPENDENT_AMBULATORY_CARE_PROVIDER_SITE_OTHER): Payer: BC Managed Care – PPO

## 2012-07-02 DIAGNOSIS — D72829 Elevated white blood cell count, unspecified: Secondary | ICD-10-CM

## 2012-07-03 LAB — CBC WITH DIFFERENTIAL/PLATELET
Basophils Relative: 0.2 % (ref 0.0–3.0)
Eosinophils Absolute: 0.5 10*3/uL (ref 0.0–0.7)
Eosinophils Relative: 9.4 % — ABNORMAL HIGH (ref 0.0–5.0)
Lymphocytes Relative: 27.3 % (ref 12.0–46.0)
MCHC: 33.1 g/dL (ref 30.0–36.0)
Monocytes Relative: 7.3 % (ref 3.0–12.0)
Neutrophils Relative %: 55.8 % (ref 43.0–77.0)
RBC: 4.4 Mil/uL (ref 3.87–5.11)
WBC: 5.8 10*3/uL (ref 4.5–10.5)

## 2012-07-09 ENCOUNTER — Telehealth: Payer: Self-pay | Admitting: Family Medicine

## 2012-07-09 DIAGNOSIS — Z1211 Encounter for screening for malignant neoplasm of colon: Secondary | ICD-10-CM | POA: Insufficient documentation

## 2012-07-09 DIAGNOSIS — D649 Anemia, unspecified: Secondary | ICD-10-CM | POA: Insufficient documentation

## 2012-07-09 NOTE — Telephone Encounter (Signed)
Message copied by Judy Pimple on Wed Jul 09, 2012  1:55 PM ------      Message from: Shon Millet      Created: Wed Jul 09, 2012 10:14 AM       Pt advise of your recommendations and agrees with the GI referral.

## 2012-07-09 NOTE — Telephone Encounter (Signed)
Understood- still not a bad idea to have the GI visit to talk to the doctor in the meantime, but that is up to her

## 2012-07-09 NOTE — Telephone Encounter (Signed)
Called patient to help make the Colonoscopy appt. Patient says she called her insurance and they will not pay anything on a diagnostic Colonoscopy so she wants to wait until next year to have it. She will start the iron pill and will call you if she has any problems. Says she would have to pay for the whole test and cant afford it.

## 2012-07-10 NOTE — Telephone Encounter (Signed)
Ok, re check cbc with ferritin for anemia in 4-8 weeks please, thanks

## 2012-07-10 NOTE — Telephone Encounter (Signed)
Left voicemail requesting pt to call our office, will try to call back later 

## 2012-07-10 NOTE — Telephone Encounter (Signed)
Pt declined the GI referral, pt said she will just wait and start taking iron, but if she changes her mind she will call us

## 2012-07-11 NOTE — Telephone Encounter (Signed)
Pt moved her f/u appt with you to 09/05/12, and said she will just get the lab work done at that OV

## 2012-07-11 NOTE — Telephone Encounter (Signed)
That is fine 

## 2012-08-08 ENCOUNTER — Ambulatory Visit: Payer: BC Managed Care – PPO | Admitting: Family Medicine

## 2012-09-05 ENCOUNTER — Ambulatory Visit: Payer: BC Managed Care – PPO | Admitting: Family Medicine

## 2012-09-08 ENCOUNTER — Ambulatory Visit: Payer: BC Managed Care – PPO | Admitting: Family Medicine

## 2012-09-26 ENCOUNTER — Ambulatory Visit: Payer: BC Managed Care – PPO | Admitting: Family Medicine

## 2012-10-22 ENCOUNTER — Ambulatory Visit: Payer: BC Managed Care – PPO | Admitting: Family Medicine

## 2012-11-14 ENCOUNTER — Ambulatory Visit: Payer: BC Managed Care – PPO | Admitting: Family Medicine

## 2012-12-05 ENCOUNTER — Ambulatory Visit: Payer: BC Managed Care – PPO | Admitting: Family Medicine

## 2012-12-12 ENCOUNTER — Encounter: Payer: Self-pay | Admitting: Family Medicine

## 2012-12-12 ENCOUNTER — Ambulatory Visit (INDEPENDENT_AMBULATORY_CARE_PROVIDER_SITE_OTHER): Payer: Managed Care, Other (non HMO) | Admitting: Family Medicine

## 2012-12-12 VITALS — BP 134/90 | HR 94 | Temp 98.1°F | Ht 66.75 in | Wt 184.2 lb

## 2012-12-12 DIAGNOSIS — Z1211 Encounter for screening for malignant neoplasm of colon: Secondary | ICD-10-CM

## 2012-12-12 DIAGNOSIS — D649 Anemia, unspecified: Secondary | ICD-10-CM

## 2012-12-12 DIAGNOSIS — L570 Actinic keratosis: Secondary | ICD-10-CM | POA: Insufficient documentation

## 2012-12-12 NOTE — Progress Notes (Signed)
Subjective:    Patient ID: Deborah Orozco, female    DOB: 1955/08/01, 57 y.o.   MRN: [id number]  HPI Here for f/u of chronic illnesses  Is doing well overall   Lost her mother in October - grief is starting to improve  Did grief counseling at hospice Is doing ok   Summer is not too busy so far   Wt is down 6 lb with bmi of 29 Joined weight watchers  "simply filling plan" - sticks with foods on a list  Right now 6.5 lb lost  Is also getting some exercise- at the gym 3-4 times per week and wears a pedometer   Anemia Lab Results  Component Value Date   WBC 5.8 07/02/2012   HGB 11.9* 07/02/2012   HCT 35.8* 07/02/2012   MCV 81.3 07/02/2012   PLT 218.0 07/02/2012   slight anemia  She gives blood every 6 weeks  Eats iron rich foods  Donated blood on Sunday    colonosc was 12/09- not due for f/u yet  Family hx of colon cancer Will be due in jan   Has a spot on face - has been there 6 mo - started as a dry patch and then changed - raised bump under L eye  Is good with sun protection   Patient Active Problem List   Diagnosis Date Noted  . Anemia 07/09/2012  . Colon cancer screening 07/09/2012  . Breast screening, unspecified 04/08/2012  . Routine general medical examination at a health care facility 12/20/2011  . UNSPECIFIED VITAMIN D DEFICIENCY 03/14/2010   Past Medical History  Diagnosis Date  . Post-menopausal    No past surgical history on file. History  Substance Use Topics  . Smoking status: Never Smoker   . Smokeless tobacco: Not on file  . Alcohol Use: Yes     Comment: social   Family History  Problem Relation Age of Onset  . Cancer Mother 39    Breast twice recurred at 46/colon/liver  . Diabetes Mother   . Hypertension Mother   . Hypertension Father   . Heart disease Brother   . Cancer Maternal Grandfather     possible colon cancer unsure of exact location gastro/tn   No Known Allergies Current Outpatient Prescriptions on File Prior to Visit   Medication Sig Dispense Refill  . Pseudoephedrine-Ibuprofen (ADVIL COLD & SINUS LIQUI-GELS PO) Take by mouth as needed.       No current facility-administered medications on file prior to visit.    Review of Systems Review of Systems  Constitutional: Negative for fever, appetite change, fatigue and unexpected weight change.  Eyes: Negative for pain and visual disturbance.  Respiratory: Negative for cough and shortness of breath.   Cardiovascular: Negative for cp or palpitations    Gastrointestinal: Negative for nausea, diarrhea and constipation.  Genitourinary: Negative for urgency and frequency.  Skin: Negative for pallor or rash  pos for scaly lesion on face  Neurological: Negative for weakness, light-headedness, numbness and headaches.  Hematological: Negative for adenopathy. Does not bruise/bleed easily.  Psychiatric/Behavioral: Negative for dysphoric mood. The patient is not nervous/anxious.         Objective:   Physical Exam  Constitutional: She appears well-developed and well-nourished. No distress.  overwt and well app  HENT:  Head: Normocephalic and atraumatic.  Mouth/Throat: Oropharynx is clear and moist.  Eyes: Conjunctivae and EOM are normal. Pupils are equal, round, and reactive to light. No scleral icterus.  Neck: Normal range of motion. Neck  supple. No JVD present. Carotid bruit is not present. No thyromegaly present.  Cardiovascular: Normal rate, regular rhythm, normal heart sounds and intact distal pulses.   Rate in high 80s at rest  Pulmonary/Chest: Effort normal and breath sounds normal. No respiratory distress. She has no wheezes.  Abdominal: Soft. Bowel sounds are normal. She exhibits no distension, no abdominal bruit and no mass. There is no tenderness.  Musculoskeletal: She exhibits no edema.  Lymphadenopathy:    She has no cervical adenopathy.  Neurological: She is alert. She has normal reflexes. No cranial nerve deficit. She exhibits normal muscle tone.  Coordination normal.  Skin: Skin is warm and dry. No rash noted. No pallor.  .5 cm area of scale under L eye - without color - resembles [place]  Some lentigos  Psychiatric: She has a normal mood and affect.          Assessment & Plan:

## 2012-12-12 NOTE — Assessment & Plan Note (Signed)
Small below L eye  tx with cryo tx  Will update if not better in 2 weeks Counseled on sun protection

## 2012-12-12 NOTE — Assessment & Plan Note (Signed)
Due for 5 year colonsoc in dec - will call then to set it up for January  [place]

## 2012-12-12 NOTE — Patient Instructions (Signed)
Call us in December to schedule a colonoscopy for screening in January  Schedule non fasting lab in 1 month to re check blood count -anemia may be from blood donation Keep spot on face clean - and if it does not go away let me know

## 2012-12-12 NOTE — Assessment & Plan Note (Signed)
Suspect mild anemia is from blood donation Will re check 1 mo since she just donated  colonosc for screen due in winter

## 2013-01-30 ENCOUNTER — Other Ambulatory Visit: Payer: Managed Care, Other (non HMO)

## 2013-03-06 ENCOUNTER — Other Ambulatory Visit: Payer: Managed Care, Other (non HMO)

## 2013-03-18 ENCOUNTER — Encounter: Payer: Self-pay | Admitting: Family Medicine

## 2013-03-19 ENCOUNTER — Other Ambulatory Visit: Payer: Managed Care, Other (non HMO)

## 2013-04-01 DIAGNOSIS — H18459 Nodular corneal degeneration, unspecified eye: Secondary | ICD-10-CM | POA: Insufficient documentation

## 2013-05-05 ENCOUNTER — Other Ambulatory Visit: Payer: Self-pay

## 2013-05-05 DIAGNOSIS — Z1231 Encounter for screening mammogram for malignant neoplasm of breast: Secondary | ICD-10-CM

## 2013-05-14 ENCOUNTER — Ambulatory Visit
Admission: RE | Admit: 2013-05-14 | Discharge: 2013-05-14 | Disposition: A | Discharge status: Home / Self Care | Payer: Managed Care, Other (non HMO) | Source: Ambulatory Visit

## 2013-05-14 DIAGNOSIS — Z1231 Encounter for screening mammogram for malignant neoplasm of breast: Secondary | ICD-10-CM

## 2013-05-14 IMAGING — MG MM DIGITAL SCREENING BILAT
4 series · 4 of 4 positions shown · non-contrast
Comparison: Previous exam(s).

CLINICAL DATA: Screening.

EXAM:
DIGITAL SCREENING BILATERAL MAMMOGRAM WITH CAD

[R MLO]
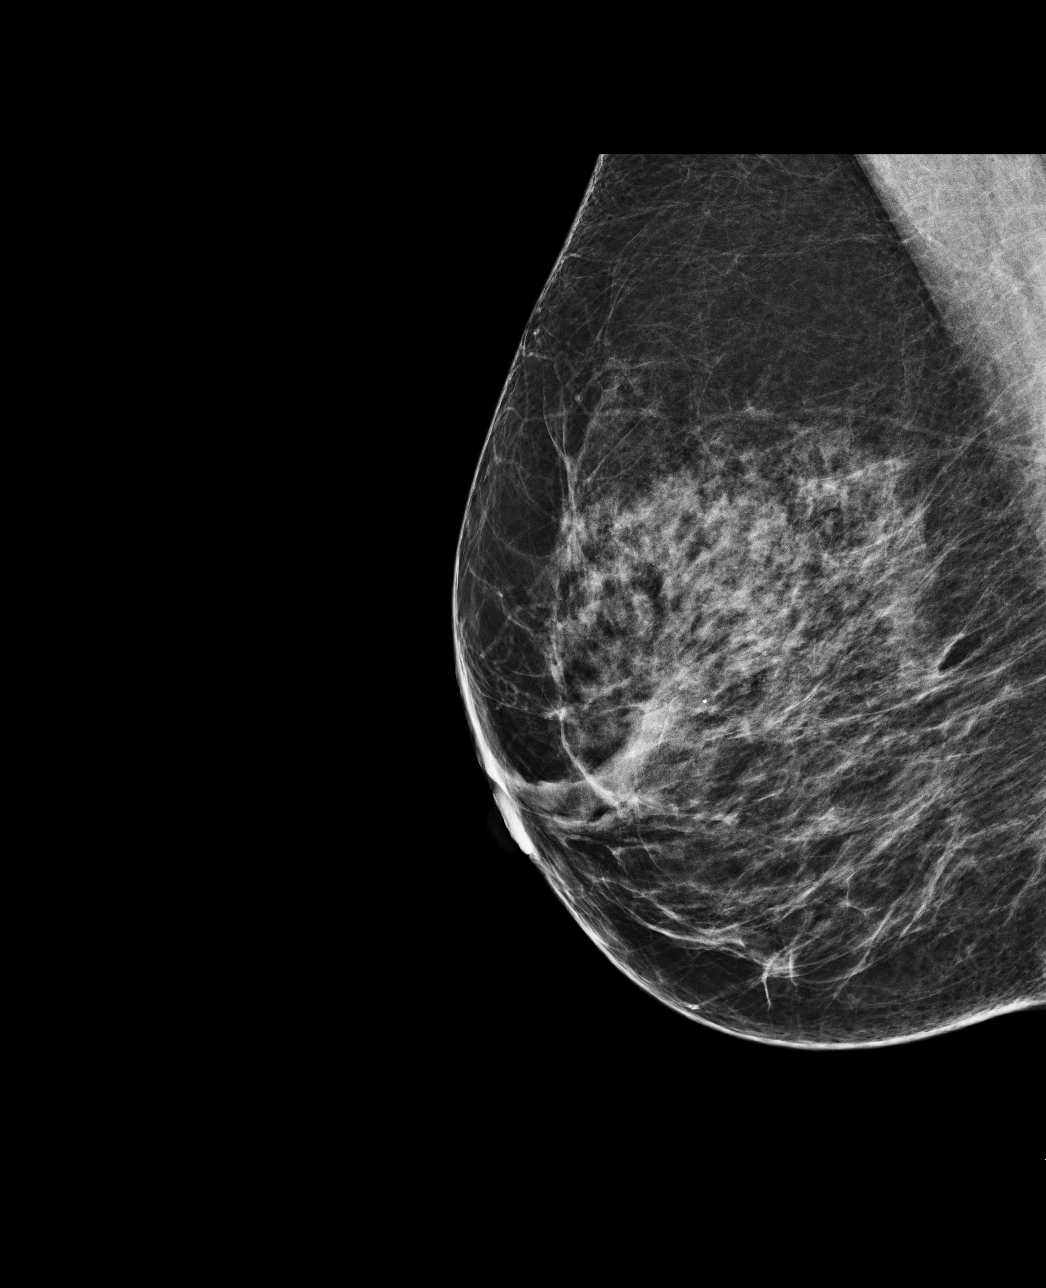

[L CC]
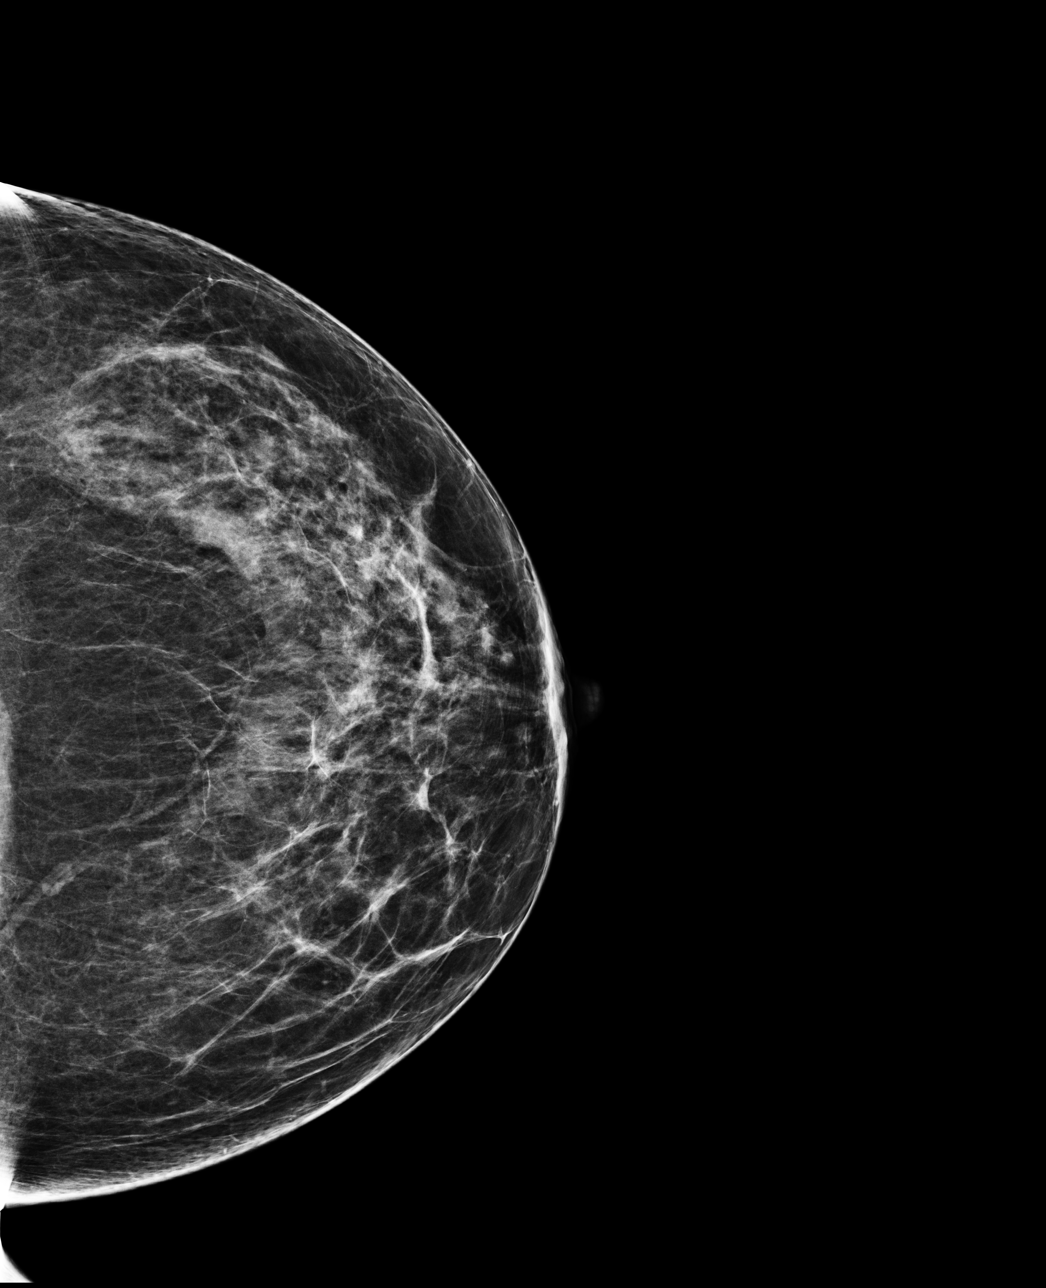

[R CC]
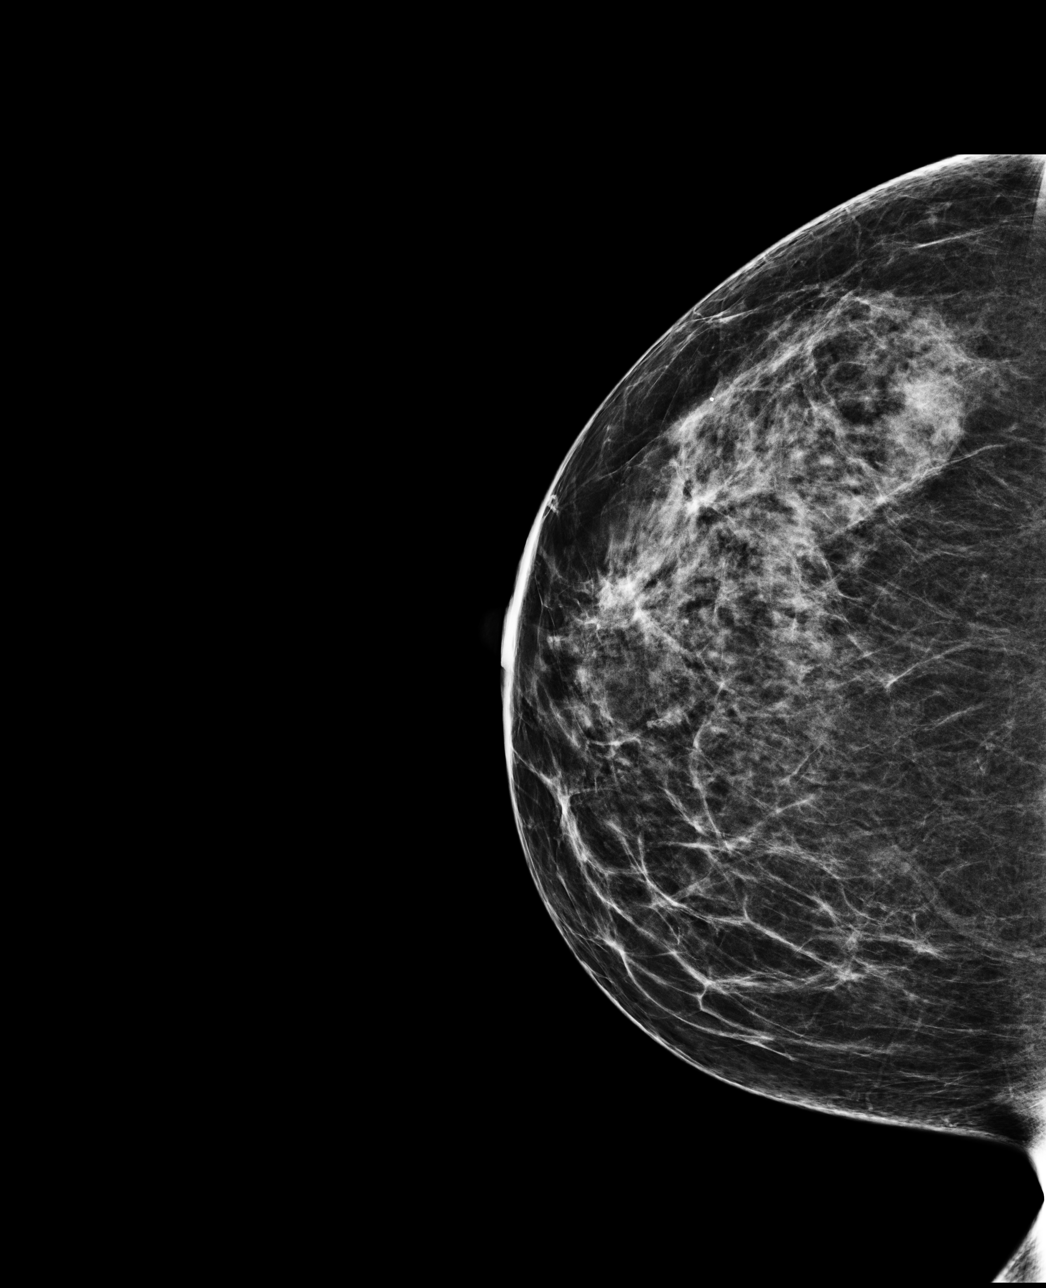

[L MLO]
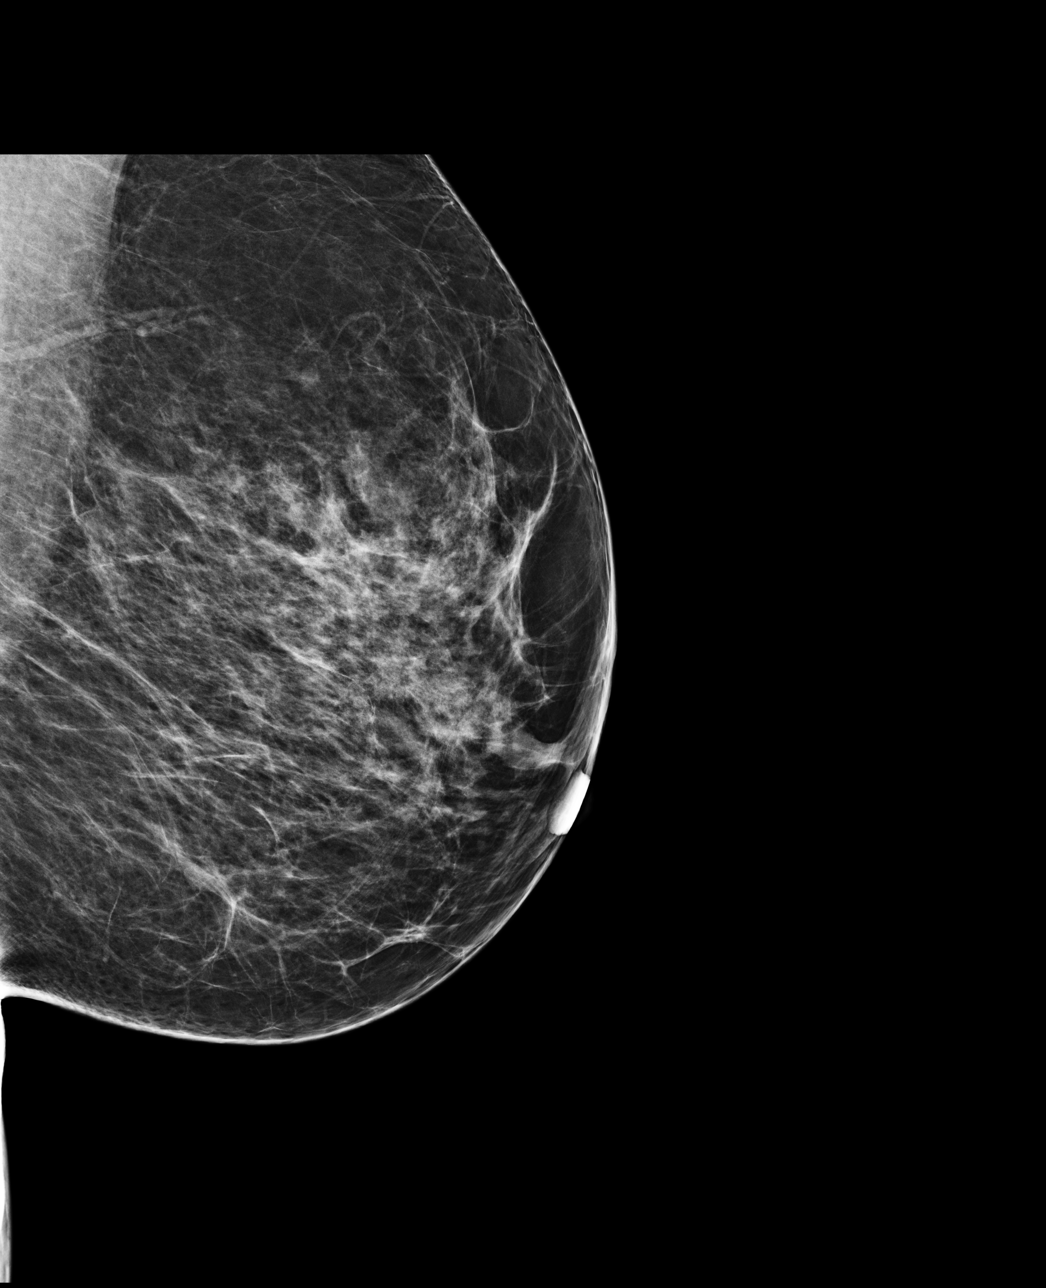

[4 of 4 positions shown; findings below may reference images not displayed]

ACR Breast Density Category c: The breasts are heterogeneously
dense, which may obscure small masses.
FINDINGS: There are no findings suspicious for malignancy. Images were
processed with CAD.
IMPRESSION: No mammographic evidence of malignancy. A result letter of this
screening mammogram will be mailed directly to the patient.

RECOMMENDATION:
Screening mammogram in one year. (Code:[M7])

BI-RADS CATEGORY  1: Negative

## 2013-05-28 ENCOUNTER — Other Ambulatory Visit: Payer: Self-pay

## 2013-06-04 ENCOUNTER — Ambulatory Visit (INDEPENDENT_AMBULATORY_CARE_PROVIDER_SITE_OTHER): Payer: Managed Care, Other (non HMO)

## 2013-06-04 DIAGNOSIS — Z23 Encounter for immunization: Secondary | ICD-10-CM

## 2013-06-05 ENCOUNTER — Other Ambulatory Visit: Payer: BC Managed Care – PPO

## 2013-06-12 ENCOUNTER — Encounter: Payer: BC Managed Care – PPO | Admitting: Family Medicine

## 2013-07-23 HISTORY — PX: COLONOSCOPY: SHX174

## 2013-07-27 ENCOUNTER — Ambulatory Visit: Payer: Managed Care, Other (non HMO) | Admitting: Family Medicine

## 2013-10-07 ENCOUNTER — Telehealth: Payer: Self-pay | Admitting: Family Medicine

## 2013-10-07 DIAGNOSIS — Z0000 Encounter for general adult medical examination without abnormal findings: Secondary | ICD-10-CM

## 2013-10-07 DIAGNOSIS — E559 Vitamin D deficiency, unspecified: Secondary | ICD-10-CM

## 2013-10-07 NOTE — Telephone Encounter (Signed)
Message copied by Abner Greenspan on Wed Oct 07, 2013  6:46 AM ------      Message from: Ellamae Sia      Created: Fri Oct 02, 2013 10:20 AM      Regarding: Lab orders for Wednesday, 3.19.15       Patient is scheduled for CPX labs, please order future labs, Thanks , Deborah Orozco       ------

## 2013-10-08 ENCOUNTER — Other Ambulatory Visit: Payer: Managed Care, Other (non HMO)

## 2013-10-09 ENCOUNTER — Telehealth: Payer: Self-pay | Admitting: Family Medicine

## 2013-10-09 NOTE — Telephone Encounter (Signed)
Deborah Orozco called and needed to change her April cpe. The next available appt is not until July. Would you be able to accommodate her in either May or June?

## 2013-10-11 NOTE — Telephone Encounter (Signed)
Please put her in for any 30 min slot-thanks

## 2013-10-14 ENCOUNTER — Encounter: Payer: Managed Care, Other (non HMO) | Admitting: Family Medicine

## 2013-11-06 ENCOUNTER — Other Ambulatory Visit: Payer: Managed Care, Other (non HMO)

## 2013-11-13 ENCOUNTER — Encounter: Payer: Managed Care, Other (non HMO) | Admitting: Family Medicine

## 2013-11-13 ENCOUNTER — Other Ambulatory Visit (INDEPENDENT_AMBULATORY_CARE_PROVIDER_SITE_OTHER): Payer: Managed Care, Other (non HMO)

## 2013-11-13 DIAGNOSIS — E559 Vitamin D deficiency, unspecified: Secondary | ICD-10-CM

## 2013-11-13 DIAGNOSIS — Z0000 Encounter for general adult medical examination without abnormal findings: Secondary | ICD-10-CM

## 2013-11-13 LAB — CBC WITH DIFFERENTIAL/PLATELET
BASOS ABS: 0 10*3/uL (ref 0.0–0.1)
Basophils Relative: 0.8 % (ref 0.0–3.0)
EOS PCT: 10.5 % — AB (ref 0.0–5.0)
Eosinophils Absolute: 0.6 10*3/uL (ref 0.0–0.7)
HCT: 35.3 % — ABNORMAL LOW (ref 36.0–46.0)
Hemoglobin: 11.7 g/dL — ABNORMAL LOW (ref 12.0–15.0)
LYMPHS ABS: 1.6 10*3/uL (ref 0.7–4.0)
Lymphocytes Relative: 30 % (ref 12.0–46.0)
MCHC: 33.2 g/dL (ref 30.0–36.0)
MCV: 83.1 fl (ref 78.0–100.0)
MONO ABS: 0.4 10*3/uL (ref 0.1–1.0)
MONOS PCT: 8 % (ref 3.0–12.0)
NEUTROS PCT: 50.7 % (ref 43.0–77.0)
Neutro Abs: 2.7 10*3/uL (ref 1.4–7.7)
PLATELETS: 218 10*3/uL (ref 150.0–400.0)
RBC: 4.25 Mil/uL (ref 3.87–5.11)
RDW: 13.3 % (ref 11.5–14.6)
WBC: 5.4 10*3/uL (ref 4.5–10.5)

## 2013-11-14 LAB — VITAMIN D 25 HYDROXY (VIT D DEFICIENCY, FRACTURES): VIT D 25 HYDROXY: 32 ng/mL (ref 30–89)

## 2013-11-16 LAB — COMPREHENSIVE METABOLIC PANEL
ALBUMIN: 4.1 g/dL (ref 3.5–5.2)
ALK PHOS: 66 U/L (ref 39–117)
ALT: 20 U/L (ref 0–35)
AST: 32 U/L (ref 0–37)
BILIRUBIN TOTAL: 0.6 mg/dL (ref 0.3–1.2)
BUN: 20 mg/dL (ref 6–23)
CO2: 26 mEq/L (ref 19–32)
Calcium: 9.4 mg/dL (ref 8.4–10.5)
Chloride: 109 mEq/L (ref 96–112)
Creatinine, Ser: 0.7 mg/dL (ref 0.4–1.2)
GFR: 96.82 mL/min (ref >60.00)
GLUCOSE: 80 mg/dL (ref 70–99)
POTASSIUM: 4.6 meq/L (ref 3.5–5.1)
SODIUM: 144 meq/L (ref 135–145)
TOTAL PROTEIN: 6.3 g/dL (ref 6.0–8.3)

## 2013-11-16 LAB — LIPID PANEL
CHOL/HDL RATIO: 3
CHOLESTEROL: 212 mg/dL — AB (ref 0–200)
HDL: 76 mg/dL (ref >39.00)
LDL CALC: 120 mg/dL — AB (ref 0–99)
Triglycerides: 82 mg/dL (ref 0.0–149.0)
VLDL: 16.4 mg/dL (ref 0.0–40.0)

## 2013-11-16 LAB — TSH: TSH: 2.01 u[IU]/mL (ref 0.35–5.50)

## 2013-11-20 ENCOUNTER — Encounter: Payer: Self-pay | Admitting: Family Medicine

## 2013-11-20 ENCOUNTER — Ambulatory Visit (INDEPENDENT_AMBULATORY_CARE_PROVIDER_SITE_OTHER): Payer: Managed Care, Other (non HMO) | Admitting: Family Medicine

## 2013-11-20 VITALS — BP 142/96 | HR 74 | Temp 98.4°F | Ht 67.25 in | Wt 184.2 lb

## 2013-11-20 DIAGNOSIS — E559 Vitamin D deficiency, unspecified: Secondary | ICD-10-CM

## 2013-11-20 DIAGNOSIS — Z800 Family history of malignant neoplasm of digestive organs: Secondary | ICD-10-CM

## 2013-11-20 DIAGNOSIS — Z0000 Encounter for general adult medical examination without abnormal findings: Secondary | ICD-10-CM

## 2013-11-20 DIAGNOSIS — D649 Anemia, unspecified: Secondary | ICD-10-CM

## 2013-11-20 NOTE — Progress Notes (Signed)
Subjective:    Patient ID: Deborah Orozco, female    DOB: 1956-05-26, 58 y.o.   MRN: [id number]  HPI Here for health maintenance exam and to review chronic medical problems    Wt is stable with bmi of 28  No complaints  Works crazy hours during tax season -now can take a breath  bp is up  BP Readings from Last 3 Encounters:  11/20/13 142/96  12/12/12 134/90  06/06/12 136/98    Thinks her mother had HTN  No major stress  Has been on [employer] "natural accelerator" for metabolism -- chromium / also green tea and other things  That may be why it is up   Mammogram 10/14  Self exam -no lumps  Pap was 01/04/12 -she sees gyn and will schedule appt for a follow up   Is due for colonoscopy  5 year f/u due to family history -mother had colon cancer    Td 8/11    Results for orders placed in visit on 11/13/13  CBC WITH DIFFERENTIAL      Result Value Ref Range   WBC 5.4  4.5 - 10.5 K/uL   RBC 4.25  3.87 - 5.11 Mil/uL   Hemoglobin 11.7 (*) 12.0 - 15.0 g/dL   HCT 35.3 (*) 36.0 - 46.0 %   MCV 83.1  78.0 - 100.0 fl   MCHC 33.2  30.0 - 36.0 g/dL   RDW 13.3  11.5 - 14.6 %   Platelets 218.0  150.0 - 400.0 K/uL   Neutrophils Relative % 50.7  43.0 - 77.0 %   Lymphocytes Relative 30.0  12.0 - 46.0 %   Monocytes Relative 8.0  3.0 - 12.0 %   Eosinophils Relative 10.5 (*) 0.0 - 5.0 %   Basophils Relative 0.8  0.0 - 3.0 %   Neutro Abs 2.7  1.4 - 7.7 K/uL   Lymphs Abs 1.6  0.7 - 4.0 K/uL   Monocytes Absolute 0.4  0.1 - 1.0 K/uL   Eosinophils Absolute 0.6  0.0 - 0.7 K/uL   Basophils Absolute 0.0  0.0 - 0.1 K/uL  COMPREHENSIVE METABOLIC PANEL      Result Value Ref Range   Sodium 144  135 - 145 mEq/L   Potassium 4.6  3.5 - 5.1 mEq/L   Chloride 109  96 - 112 mEq/L   CO2 26  19 - 32 mEq/L   Glucose, Bld 80  70 - 99 mg/dL   BUN 20  6 - 23 mg/dL   Creatinine, Ser 0.7  0.4 - 1.2 mg/dL   Total Bilirubin 0.6  0.3 - 1.2 mg/dL   Alkaline Phosphatase 66  39 - 117 U/L   AST 32  0 - 37 U/L     ALT 20  0 - 35 U/L   Total Protein 6.3  6.0 - 8.3 g/dL   Albumin 4.1  3.5 - 5.2 g/dL   Calcium 9.4  8.4 - 10.5 mg/dL   GFR 96.82  >60.00 mL/min  LIPID PANEL      Result Value Ref Range   Cholesterol 212 (*) 0 - 200 mg/dL   Triglycerides 82.0  0.0 - 149.0 mg/dL   HDL 76.00  >39.00 mg/dL   VLDL 16.4  0.0 - 40.0 mg/dL   LDL Cholesterol 120 (*) 0 - 99 mg/dL   Total CHOL/HDL Ratio 3    TSH      Result Value Ref Range   TSH 2.01  0.35 - 5.50 uIU/mL  VITAMIN  D 25 HYDROXY      Result Value Ref Range   Vit D, 25-Hydroxy 32  30 - 89 ng/mL    Anemia - very slight and she donates blood   Cholesterol LDL 120 Diet fair   Needs more vit D Only taking mvi   Patient Active Problem List   Diagnosis Date Noted  . Family history of colon cancer 11/20/2013  . Actinic keratosis 12/12/2012  . Anemia 07/09/2012  . Colon cancer screening 07/09/2012  . Breast screening, unspecified 04/08/2012  . Routine general medical examination at a health care facility 12/20/2011  . UNSPECIFIED VITAMIN D DEFICIENCY 03/14/2010   Past Medical History  Diagnosis Date  . Post-menopausal    No past surgical history on file. History  Substance Use Topics  . Smoking status: Never Smoker   . Smokeless tobacco: Not on file  . Alcohol Use: Yes     Comment: social   Family History  Problem Relation Age of Onset  . Cancer Mother 15    Breast twice recurred at 46/colon/liver  . Diabetes Mother   . Hypertension Mother   . Hypertension Father   . Heart disease Brother   . Cancer Maternal Grandfather     possible colon cancer unsure of exact location gastro/tn   No Known Allergies No current outpatient prescriptions on file prior to visit.   No current facility-administered medications on file prior to visit.    Review of Systems Review of Systems  Constitutional: Negative for fever, appetite change, and unexpected weight change.  Eyes: Negative for pain and visual disturbance.  Respiratory:  Negative for cough and shortness of breath.   Cardiovascular: Negative for cp or palpitations    Gastrointestinal: Negative for nausea, diarrhea and constipation.  Genitourinary: Negative for urgency and frequency.  Skin: Negative for pallor or rash   Neurological: Negative for weakness, light-headedness, numbness and headaches.  Hematological: Negative for adenopathy. Does not bruise/bleed easily.  Psychiatric/Behavioral: Negative for dysphoric mood. The patient is not nervous/anxious.         Objective:   Physical Exam  Constitutional: She appears well-developed and well-nourished. No distress.  HENT:  Head: Normocephalic and atraumatic.  Right Ear: External ear normal.  Left Ear: External ear normal.  Nose: Nose normal.  Mouth/Throat: Oropharynx is clear and moist.  Eyes: Conjunctivae and EOM are normal. Pupils are equal, round, and reactive to light. Right eye exhibits no discharge. Left eye exhibits no discharge. No scleral icterus.  Neck: Normal range of motion. Neck supple. No JVD present. No thyromegaly present.  Cardiovascular: Normal rate, regular rhythm, normal heart sounds and intact distal pulses.  Exam reveals no gallop.   Pulmonary/Chest: Effort normal and breath sounds normal. No respiratory distress. She has no wheezes. She has no rales.  Abdominal: Soft. Bowel sounds are normal. She exhibits no distension and no mass. There is no tenderness.  Musculoskeletal: She exhibits no edema and no tenderness.  No kyphosis   Lymphadenopathy:    She has no cervical adenopathy.  Neurological: She is alert. She has normal reflexes. No cranial nerve deficit. She exhibits normal muscle tone. Coordination normal.  Skin: Skin is warm and dry. No rash noted. No erythema. No pallor.  Psychiatric: She has a normal mood and affect.          Assessment & Plan:

## 2013-11-20 NOTE — Progress Notes (Signed)
Pre visit review using our clinic review tool, if applicable. No additional management support is needed unless otherwise documented below in the visit note. 

## 2013-11-20 NOTE — Patient Instructions (Signed)
Follow up in about a month for re check of blood pressure  Stop up front for GI referral for colonoscopy Watch fat in diet for cholesterol  Take vitamin D3 over the counter 2000 iu daily for bone health  Keep exercising      DASH Diet The DASH diet stands for "Dietary Approaches to Stop Hypertension." It is a healthy eating plan that has been shown to reduce high blood pressure (hypertension) in as little as 14 days, while also possibly providing other significant health benefits. These other health benefits include reducing the risk of breast cancer after menopause and reducing the risk of type 2 diabetes, heart disease, colon cancer, and stroke. Health benefits also include weight loss and slowing kidney failure in patients with chronic kidney disease.  DIET GUIDELINES  Limit salt (sodium). Your diet should contain less than 1500 mg of sodium daily.  Limit refined or processed carbohydrates. Your diet should include mostly whole grains. Desserts and added sugars should be used sparingly.  Include small amounts of heart-healthy fats. These types of fats include nuts, oils, and tub margarine. Limit saturated and trans fats. These fats have been shown to be harmful in the body. CHOOSING FOODS  The following food groups are based on a 2000 calorie diet. See your Registered Dietitian for individual calorie needs. Grains and Grain Products (6 to 8 servings daily)  Eat More Often: Whole-wheat bread, brown rice, whole-grain or wheat pasta, quinoa, popcorn without added fat or salt (air popped).  Eat Less Often: White bread, white pasta, white rice, cornbread. Vegetables (4 to 5 servings daily)  Eat More Often: Fresh, frozen, and canned vegetables. Vegetables may be raw, steamed, roasted, or grilled with a minimal amount of fat.  Eat Less Often/Avoid: Creamed or fried vegetables. Vegetables in a cheese sauce. Fruit (4 to 5 servings daily)  Eat More Often: All fresh, canned (in natural  juice), or frozen fruits. Dried fruits without added sugar. One hundred percent fruit juice ( cup [237 mL] daily).  Eat Less Often: Dried fruits with added sugar. Canned fruit in light or heavy syrup. [employer], Fish, and Poultry (2 servings or less daily. One serving is 3 to 4 oz [85-114 g]).  Eat More Often: Ninety percent or leaner ground beef, tenderloin, sirloin. Round cuts of beef, chicken breast, [place] breast. All fish. Grill, bake, or broil your meat. Nothing should be fried.  Eat Less Often/Avoid: Fatty cuts of meat, [place], or chicken leg, thigh, or wing. Fried cuts of meat or fish. Dairy (2 to 3 servings)  Eat More Often: Low-fat or fat-free milk, low-fat plain or light yogurt, reduced-fat or part-skim cheese.  Eat Less Often/Avoid: Milk (whole, 2%).Whole milk yogurt. Full-fat cheeses. Nuts, Seeds, and Legumes (4 to 5 servings per week)  Eat More Often: All without added salt.  Eat Less Often/Avoid: Salted nuts and seeds, canned beans with added salt. Fats and Sweets (limited)  Eat More Often: Vegetable oils, tub margarines without trans fats, sugar-free gelatin. Mayonnaise and salad dressings.  Eat Less Often/Avoid: Coconut oils, palm oils, butter, stick margarine, cream, half and half, cookies, candy, pie. FOR MORE INFORMATION The Dash Diet Eating Plan: www.dashdiet.org Document Released: 06/28/2011 Document Revised: 10/01/2011 Document Reviewed: 06/28/2011 [facility] Patient Information 2014 [place], [place].

## 2013-11-22 NOTE — Assessment & Plan Note (Signed)
Level in the low 30s Will add D3 another 2000 iu daily  Disc importance of this to bone and overall health

## 2013-11-22 NOTE — Assessment & Plan Note (Signed)
Ref for 5 year screen colonosc

## 2013-11-22 NOTE — Assessment & Plan Note (Signed)
Stable Pt suspects this is from regular blood donation

## 2013-11-22 NOTE — Assessment & Plan Note (Signed)
Reviewed health habits including diet and exercise and skin cancer prevention Reviewed appropriate screening tests for age  Also reviewed health mt list, fam hx and immunization status , as well as social and family history   Labs reviewed  Ref for colonosc

## 2013-11-23 ENCOUNTER — Encounter: Payer: Self-pay | Admitting: Family Medicine

## 2013-12-04 ENCOUNTER — Encounter: Payer: Self-pay | Admitting: Internal Medicine

## 2013-12-25 ENCOUNTER — Ambulatory Visit: Payer: Managed Care, Other (non HMO) | Admitting: Family Medicine

## 2014-01-27 ENCOUNTER — Ambulatory Visit (AMBULATORY_SURGERY_CENTER): Payer: Self-pay | Admitting: *Deleted

## 2014-01-27 ENCOUNTER — Telehealth: Payer: Self-pay | Admitting: *Deleted

## 2014-01-27 ENCOUNTER — Ambulatory Visit: Payer: Managed Care, Other (non HMO) | Admitting: Family Medicine

## 2014-01-27 VITALS — Ht 67.0 in | Wt 189.6 lb

## 2014-01-27 DIAGNOSIS — Z800 Family history of malignant neoplasm of digestive organs: Secondary | ICD-10-CM

## 2014-01-27 MED ORDER — MOVIPREP 100 G PO SOLR
ORAL | Status: DC
Start: 2014-01-27 — End: 2014-02-15

## 2014-01-27 NOTE — Telephone Encounter (Signed)
Dr Henrene Pastor: pt is scheduled for direct colonoscopy 7/27.  Last colonoscopy 2009 normal with Dr Cherre Blanc in [place].  Report is in [facility] for review.  Pt's mother diagnosed with colon cancer at age 56.  Proceed with colonoscopy as scheduled?  Thanks, Juliann Pulse

## 2014-01-27 NOTE — Telephone Encounter (Signed)
Proceed with colonoscopy as scheduled. Thank you

## 2014-01-27 NOTE — Telephone Encounter (Signed)
noted 

## 2014-01-27 NOTE — Progress Notes (Signed)
No allergies to eggs or soy. No problems with sedation; never had anesthesia  Pt given Emmi instructions for colonoscopy  No oxygen use  No diet drug use

## 2014-02-12 ENCOUNTER — Encounter: Payer: Managed Care, Other (non HMO) | Admitting: Internal Medicine

## 2014-02-15 ENCOUNTER — Encounter: Payer: Self-pay | Admitting: Internal Medicine

## 2014-02-15 ENCOUNTER — Ambulatory Visit (AMBULATORY_SURGERY_CENTER): Payer: Managed Care, Other (non HMO) | Admitting: Internal Medicine

## 2014-02-15 VITALS — BP 134/87 | HR 59 | Temp 98.4°F | Resp 18 | Ht 67.0 in | Wt 189.0 lb

## 2014-02-15 DIAGNOSIS — Z800 Family history of malignant neoplasm of digestive organs: Secondary | ICD-10-CM

## 2014-02-15 DIAGNOSIS — Z1211 Encounter for screening for malignant neoplasm of colon: Secondary | ICD-10-CM

## 2014-02-15 MED ORDER — SODIUM CHLORIDE 0.9 % IV SOLN: 500.0000 mL | INTRAVENOUS | Status: DC

## 2014-02-15 NOTE — Op Note (Signed)
[place]  [address] [place], [address]   COLONOSCOPY PROCEDURE REPORT  PATIENT: Deborah Orozco, Deborah Orozco  MR#: [id number] BIRTHDATE: 1956-01-01 , 56  yrs. old GENDER: Female ENDOSCOPIST: Eustace Quail, MD REFERRED TU:[id number] Vernell Morgans, M.D. PROCEDURE DATE:  02/15/2014 PROCEDURE:   Colonoscopy, screening First Screening Colonoscopy - Avg.  risk and is 50 yrs.  old or older - No.  Prior Negative Screening - Now for repeat screening. N/A  History of Adenoma - Now for follow-up colonoscopy & has been > or = to 3 yrs.  N/A  Polyps Removed Today? No.  Recommend repeat exam, <10 yrs? No. ASA CLASS:   Class I INDICATIONS:Patient's immediate family history of colon cancer(mother at 67); index exam December 2009 ([place]) without neoplasia. MEDICATIONS: MAC sedation, administered by CRNA and propofol (Diprivan) 250mg  IV  DESCRIPTION OF PROCEDURE:   After the risks benefits and alternatives of the procedure were thoroughly explained, informed consent was obtained.  A digital rectal exam revealed no abnormalities of the rectum.   The [id number]  endoscope was introduced through the anus and advanced to the cecum, which was identified by both the appendix and ileocecal valve. No adverse events experienced.   The quality of the prep was excellent, using MoviPrep  The instrument was then slowly withdrawn as the colon was fully examined.  COLON FINDINGS: Moderate diverticulosis was noted in the left colon. The colon was otherwise normal.  There was no  inflammation, polyps or cancers unless previously stated.  Retroflexed views revealed internal hemorrhoids. The time to cecum=1 minutes 37 seconds.  Withdrawal time=10 minutes 27 seconds.  The scope was withdrawn and the procedure completed.  COMPLICATIONS: There were no complications.  ENDOSCOPIC IMPRESSION: 1.   Moderate diverticulosis was noted in the left colon 2.   The colon was otherwise  normal  RECOMMENDATIONS: 1. Continue current colorectal screening recommendations with a repeat colonoscopy in 10 years.   eSigned:  Eustace Quail, MD 02/15/2014 3:56 PM   cc: Abner Greenspan, MD and The Patient

## 2014-02-15 NOTE — Progress Notes (Signed)
Procedure ends, to recovery, report given and VSS. 

## 2014-02-15 NOTE — Patient Instructions (Signed)
YOU HAD AN ENDOSCOPIC PROCEDURE TODAY AT THE [facility]: Refer to the procedure report that was given to you for any specific questions about what was found during the examination.  If the procedure report does not answer your questions, please call your gastroenterologist to clarify.  If you requested that your care partner not be given the details of your procedure findings, then the procedure report has been included in a sealed envelope for you to review at your convenience later.  YOU SHOULD EXPECT: Some feelings of bloating in the abdomen. Passage of more gas than usual.  Walking can help get rid of the air that was put into your GI tract during the procedure and reduce the bloating. If you had a lower endoscopy (such as a colonoscopy or flexible sigmoidoscopy) you may notice spotting of blood in your stool or on the toilet paper. If you underwent a bowel prep for your procedure, then you may not have a normal bowel movement for a few days.  DIET: Your first meal following the procedure should be a light meal and then it is ok to progress to your normal diet.  A half-sandwich or bowl of soup is an example of a good first meal.  Heavy or fried foods are harder to digest and may make you feel nauseous or bloated.  Likewise meals heavy in dairy and vegetables can cause extra gas to form and this can also increase the bloating.  Drink plenty of fluids but you should avoid alcoholic beverages for 24 hours.  ACTIVITY: Your care partner should take you home directly after the procedure.  You should plan to take it easy, moving slowly for the rest of the day.  You can resume normal activity the day after the procedure however you should NOT DRIVE or use heavy machinery for 24 hours (because of the sedation medicines used during the test).    SYMPTOMS TO REPORT IMMEDIATELY: A gastroenterologist can be reached at any hour.  During normal business hours, 8:30 AM to 5:00 PM Monday through Friday,  call [phone].  After hours and on weekends, please call the GI answering service at [phone] who will take a message and have the physician on call contact you.   Following lower endoscopy (colonoscopy or flexible sigmoidoscopy):  Excessive amounts of blood in the stool  Significant tenderness or worsening of abdominal pains  Swelling of the abdomen that is new, acute  Fever of 100F or higher    FOLLOW UP: If any biopsies were taken you will be contacted by phone or by letter within the next 1-3 weeks.  Call your gastroenterologist if you have not heard about the biopsies in 3 weeks.  Our staff will call the home number listed on your records the next business day following your procedure to check on you and address any questions or concerns that you may have at that time regarding the information given to you following your procedure. This is a courtesy call and so if there is no answer at the home number and we have not heard from you through the emergency physician on call, we will assume that you have returned to your regular daily activities without incident.  SIGNATURES/CONFIDENTIALITY: You and/or your care partner have signed paperwork which will be entered into your electronic medical record.  These signatures attest to the fact that that the information above on your After Visit Summary has been reviewed and is understood.  Full responsibility of the confidentiality   of this discharge information lies with you and/or your care-partner.     

## 2014-02-16 ENCOUNTER — Telehealth: Payer: Self-pay

## 2014-02-16 NOTE — Telephone Encounter (Signed)
No answer, left vm 

## 2014-03-05 ENCOUNTER — Ambulatory Visit: Payer: Managed Care, Other (non HMO) | Admitting: Family Medicine

## 2014-03-26 ENCOUNTER — Ambulatory Visit: Payer: Managed Care, Other (non HMO) | Admitting: Family Medicine

## 2014-04-16 ENCOUNTER — Ambulatory Visit: Payer: Managed Care, Other (non HMO) | Admitting: Family Medicine

## 2014-04-21 ENCOUNTER — Ambulatory Visit: Payer: Managed Care, Other (non HMO) | Admitting: Family Medicine

## 2014-05-21 ENCOUNTER — Ambulatory Visit: Payer: Managed Care, Other (non HMO) | Admitting: Family Medicine

## 2014-06-11 ENCOUNTER — Ambulatory Visit: Payer: Managed Care, Other (non HMO) | Admitting: Family Medicine

## 2014-07-09 ENCOUNTER — Ambulatory Visit: Payer: Managed Care, Other (non HMO) | Admitting: Family Medicine

## 2014-07-13 ENCOUNTER — Ambulatory Visit (INDEPENDENT_AMBULATORY_CARE_PROVIDER_SITE_OTHER): Payer: Managed Care, Other (non HMO) | Admitting: *Deleted

## 2014-07-13 ENCOUNTER — Ambulatory Visit: Payer: Managed Care, Other (non HMO)

## 2014-07-13 DIAGNOSIS — Z23 Encounter for immunization: Secondary | ICD-10-CM

## 2014-07-21 ENCOUNTER — Other Ambulatory Visit: Payer: Self-pay

## 2014-07-21 DIAGNOSIS — Z1231 Encounter for screening mammogram for malignant neoplasm of breast: Secondary | ICD-10-CM

## 2014-07-21 DIAGNOSIS — Z803 Family history of malignant neoplasm of breast: Secondary | ICD-10-CM

## 2014-08-02 ENCOUNTER — Ambulatory Visit: Payer: Managed Care, Other (non HMO)

## 2014-08-12 LAB — HM PAP SMEAR: HM PAP: NORMAL

## 2014-08-13 ENCOUNTER — Ambulatory Visit: Payer: Managed Care, Other (non HMO) | Admitting: Family Medicine

## 2014-10-13 LAB — HM DEXA SCAN

## 2014-10-19 LAB — HM MAMMOGRAPHY: HM Mammogram: NORMAL

## 2014-12-29 ENCOUNTER — Telehealth: Payer: Self-pay | Admitting: Family Medicine

## 2014-12-29 NOTE — Telephone Encounter (Signed)
Please put her in for a 30 min slot in Aug-thanks

## 2014-12-29 NOTE — Telephone Encounter (Signed)
Scheduled 03/18/15

## 2014-12-29 NOTE — Telephone Encounter (Signed)
Pt had to cancel cpe for Friday 12/31/14. She would like to reschedule in August. You next available cpe is not until 07/2015. Can you accommodate pt in August? Or pt stated she didn't mind is one of our NP's does cpe. Please advise.   Pt c/b # [phone]

## 2014-12-31 ENCOUNTER — Encounter: Payer: Managed Care, Other (non HMO) | Admitting: Family Medicine

## 2015-03-18 ENCOUNTER — Encounter: Payer: Managed Care, Other (non HMO) | Admitting: Family Medicine

## 2015-06-27 ENCOUNTER — Telehealth: Payer: Self-pay | Admitting: Family Medicine

## 2015-06-27 DIAGNOSIS — Z0000 Encounter for general adult medical examination without abnormal findings: Secondary | ICD-10-CM

## 2015-06-27 DIAGNOSIS — E559 Vitamin D deficiency, unspecified: Secondary | ICD-10-CM

## 2015-06-27 NOTE — Telephone Encounter (Signed)
-----   Message from Ellamae Sia sent at 06/23/2015 11:22 AM EST ----- Regarding: Lab orders for Tuesday, 12.6.16 Patient is scheduled for CPX labs, please order future labs, Thanks , Karna Christmas

## 2015-06-28 ENCOUNTER — Other Ambulatory Visit (INDEPENDENT_AMBULATORY_CARE_PROVIDER_SITE_OTHER): Payer: Managed Care, Other (non HMO)

## 2015-06-28 DIAGNOSIS — E559 Vitamin D deficiency, unspecified: Secondary | ICD-10-CM | POA: Diagnosis not present

## 2015-06-28 DIAGNOSIS — Z0000 Encounter for general adult medical examination without abnormal findings: Secondary | ICD-10-CM | POA: Diagnosis not present

## 2015-06-28 LAB — COMPREHENSIVE METABOLIC PANEL
ALBUMIN: 4.1 g/dL (ref 3.5–5.2)
ALT: 17 U/L (ref 0–35)
AST: 16 U/L (ref 0–37)
Alkaline Phosphatase: 74 U/L (ref 39–117)
BUN: 15 mg/dL (ref 6–23)
CHLORIDE: 106 meq/L (ref 96–112)
CO2: 29 mEq/L (ref 19–32)
CREATININE: 0.84 mg/dL (ref 0.40–1.20)
Calcium: 9.3 mg/dL (ref 8.4–10.5)
GFR: 74.15 mL/min (ref >60.00)
Glucose, Bld: 90 mg/dL (ref 70–99)
Potassium: 4 mEq/L (ref 3.5–5.1)
SODIUM: 140 meq/L (ref 135–145)
TOTAL PROTEIN: 6.4 g/dL (ref 6.0–8.3)
Total Bilirubin: 0.8 mg/dL (ref 0.2–1.2)

## 2015-06-28 LAB — CBC WITH DIFFERENTIAL/PLATELET
Basophils Absolute: 0 10*3/uL (ref 0.0–0.1)
Basophils Relative: 0.6 % (ref 0.0–3.0)
EOS ABS: 0.4 10*3/uL (ref 0.0–0.7)
Eosinophils Relative: 7.3 % — ABNORMAL HIGH (ref 0.0–5.0)
HCT: 40.8 % (ref 36.0–46.0)
HEMOGLOBIN: 13.7 g/dL (ref 12.0–15.0)
Lymphocytes Relative: 28.3 % (ref 12.0–46.0)
Lymphs Abs: 1.6 10*3/uL (ref 0.7–4.0)
MCHC: 33.5 g/dL (ref 30.0–36.0)
MCV: 86.9 fl (ref 78.0–100.0)
MONO ABS: 0.5 10*3/uL (ref 0.1–1.0)
Monocytes Relative: 9.5 % (ref 3.0–12.0)
Neutro Abs: 3.1 10*3/uL (ref 1.4–7.7)
Neutrophils Relative %: 54.3 % (ref 43.0–77.0)
Platelets: 247 10*3/uL (ref 150.0–400.0)
RBC: 4.7 Mil/uL (ref 3.87–5.11)
RDW: 13.5 % (ref 11.5–15.5)
WBC: 5.8 10*3/uL (ref 4.0–10.5)

## 2015-06-28 LAB — LIPID PANEL
CHOL/HDL RATIO: 3
CHOLESTEROL: 187 mg/dL (ref 0–200)
HDL: 63 mg/dL (ref >39.00)
LDL CALC: 107 mg/dL — AB (ref 0–99)
NonHDL: 124.11
Triglycerides: 88 mg/dL (ref 0.0–149.0)
VLDL: 17.6 mg/dL (ref 0.0–40.0)

## 2015-06-28 LAB — VITAMIN D 25 HYDROXY (VIT D DEFICIENCY, FRACTURES): VITD: 21.1 ng/mL — ABNORMAL LOW (ref 30.00–100.00)

## 2015-06-28 LAB — TSH: TSH: 1.81 u[IU]/mL (ref 0.35–4.50)

## 2015-07-05 ENCOUNTER — Encounter: Payer: Self-pay | Admitting: Family Medicine

## 2015-07-05 ENCOUNTER — Ambulatory Visit (INDEPENDENT_AMBULATORY_CARE_PROVIDER_SITE_OTHER): Payer: Managed Care, Other (non HMO) | Admitting: Family Medicine

## 2015-07-05 VITALS — BP 138/88 | HR 75 | Temp 98.6°F | Ht 67.0 in | Wt 184.5 lb

## 2015-07-05 DIAGNOSIS — Z0000 Encounter for general adult medical examination without abnormal findings: Secondary | ICD-10-CM | POA: Diagnosis not present

## 2015-07-05 DIAGNOSIS — M8580 Other specified disorders of bone density and structure, unspecified site: Secondary | ICD-10-CM | POA: Insufficient documentation

## 2015-07-05 DIAGNOSIS — Z23 Encounter for immunization: Secondary | ICD-10-CM

## 2015-07-05 DIAGNOSIS — E559 Vitamin D deficiency, unspecified: Secondary | ICD-10-CM

## 2015-07-05 DIAGNOSIS — R430 Anosmia: Secondary | ICD-10-CM | POA: Insufficient documentation

## 2015-07-05 NOTE — Assessment & Plan Note (Signed)
Level is in 76s Not taking much  Will inc to 4000-5000 iu daily  Has osteopenia Disc imp of this to bone and overall health

## 2015-07-05 NOTE — Progress Notes (Signed)
Subjective:    Patient ID: Deborah Orozco, female    DOB: 1956-03-30, 59 y.o.   MRN: [id number]  HPI Here for health maintenance exam and to review chronic medical problems    Working a lot  Will go to [employer] for [employer] - will drive  Taking care of herself  Working on weight loss  Both of her kids are getting married next year    Screen for Hep C/HIV Declines - not high risk   Pap 1/16 nl -gyn  Flu shot today  Mm 3/16 normal (gyn)  Fam hx of breast ca (mother) Self exam- no lumps   Td 8/11 Colonoscopy  7/15 -- fam hx of colon ca (mother)- 5 year recall   dexa -osteopenia 3/16 No falls or fractures  D level is low at 21---taking MVI with D - no calcium    bp is up a bit today BP Readings from Last 3 Encounters:  07/05/15 136/90  02/15/14 134/87  11/20/13 142/96   mother - HTN - family hx   Wt is down 5 lb   Lab Results  Component Value Date   WBC 5.8 06/28/2015   HGB 13.7 06/28/2015   HCT 40.8 06/28/2015   MCV 86.9 06/28/2015   PLT 247.0 06/28/2015    occ gives blood     Chemistry      Component Value Date/Time   NA 140 06/28/2015 0913   K 4.0 06/28/2015 0913   CL 106 06/28/2015 0913   CO2 29 06/28/2015 0913   BUN 15 06/28/2015 0913   CREATININE 0.84 06/28/2015 0913   CREATININE 0.77 06/06/2012 1532      Component Value Date/Time   CALCIUM 9.3 06/28/2015 0913   ALKPHOS 74 06/28/2015 0913   AST 16 06/28/2015 0913   ALT 17 06/28/2015 0913   BILITOT 0.8 06/28/2015 0913     glucose is 90  Lab Results  Component Value Date   TSH 1.81 06/28/2015    Cholesterol Lab Results  Component Value Date   CHOL 187 06/28/2015   CHOL 212* 11/13/2013   CHOL 211* 06/06/2012   Lab Results  Component Value Date   HDL 63.00 06/28/2015   HDL 76.00 11/13/2013   HDL 75 06/06/2012   Lab Results  Component Value Date   LDLCALC 107* 06/28/2015   LDLCALC 120* 11/13/2013   LDLCALC 106* 06/06/2012   Lab Results  Component Value Date   TRIG 88.0 06/28/2015   TRIG 82.0 11/13/2013   TRIG 148 06/06/2012   Lab Results  Component Value Date   CHOLHDL 3 06/28/2015   CHOLHDL 3 11/13/2013   CHOLHDL 2.8 06/06/2012   Lab Results  Component Value Date   LDLDIRECT 133.1 03/14/2010    Pretty good  Does watch her diet   C/o loss of sense of smell and taste  Has not seen ent    Patient Active Problem List   Diagnosis Date Noted  . 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 Osteopenia 07/05/2015  . Family history of colon cancer 11/20/2013  . Actinic keratosis 12/12/2012  . Anemia 07/09/2012  . Colon cancer screening 07/09/2012  . Breast screening, unspecified 04/08/2012  . Routine general medical examination at a health care facility 12/20/2011  . Vitamin D deficiency 03/14/2010   Past Medical History  Diagnosis Date  . Post-menopausal    Past Surgical History  Procedure Laterality Date  . No past surgeries     Social History  Substance Use Topics  .  Smoking status: Never Smoker   . Smokeless tobacco: Never Used  . Alcohol Use: 1.2 oz/week    2 Glasses of wine per week     Comment: occ   Family History  Problem Relation Age of Onset  . Cancer Mother 34    Breast twice recurred at 46/colon/liver  . Diabetes Mother   . Hypertension Mother   . Colon cancer Mother 10  . Hypertension Father   . Heart disease Brother   . Cancer Maternal Grandfather     possible colon cancer unsure of exact location gastro/tn   No Known Allergies Current Outpatient Prescriptions on File Prior to Visit  Medication Sig Dispense Refill  . Melatonin 3 MG TABS Take 1 tablet by mouth as needed. At bedtime    . Multiple Vitamin (MULTIVITAMIN) capsule Take by mouth. HERBALIFE     No current facility-administered medications on file prior to visit.    Review of Systems Review of Systems  Constitutional: Negative for fever, appetite change, fatigue and unexpected weight change.  Eyes: Negative for pain and visual disturbance.  ENT pos for loss of smell  and taste somewhat/pos for occ congestion  Respiratory: Negative for cough and shortness of breath.   Cardiovascular: Negative for cp or palpitations    Gastrointestinal: Negative for nausea, diarrhea and constipation.  Genitourinary: Negative for urgency and frequency.  Skin: Negative for pallor or rash   Neurological: Negative for weakness, light-headedness, numbness and headaches.  Hematological: Negative for adenopathy. Does not bruise/bleed easily.  Psychiatric/Behavioral: Negative for dysphoric mood. The patient is not nervous/anxious.         Objective:   Physical Exam  Constitutional: She appears well-developed and well-nourished. No distress.  Well appearing   HENT:  Head: Normocephalic and atraumatic.  Right Ear: External ear normal.  Left Ear: External ear normal.  Nose: Nose normal.  Mouth/Throat: Oropharynx is clear and moist.  Eyes: Conjunctivae and EOM are normal. Pupils are equal, round, and reactive to light. Right eye exhibits no discharge. Left eye exhibits no discharge. No scleral icterus.  Neck: Normal range of motion. Neck supple. No JVD present. Carotid bruit is not present. No thyromegaly present.  Cardiovascular: Normal rate, regular rhythm, normal heart sounds and intact distal pulses.  Exam reveals no gallop.   Pulmonary/Chest: Effort normal and breath sounds normal. No respiratory distress. She has no wheezes. She has no rales.  Abdominal: Soft. Bowel sounds are normal. She exhibits no distension and no mass. There is no tenderness.  Musculoskeletal: She exhibits no edema or tenderness.  No kyphosis   Lymphadenopathy:    She has no cervical adenopathy.  Neurological: She is alert. She has normal reflexes. No cranial nerve deficit. She exhibits normal muscle tone. Coordination normal.  Skin: Skin is warm and dry. No rash noted. No erythema. No pallor.  Some lentigo   Psychiatric: She has a normal mood and affect.          Assessment & Plan:    Problem List Items Addressed This Visit      Musculoskeletal and Integument   Osteopenia    Rev dexa from this year - with FN -1.7 lowest T score  Disc need for calcium/ vitamin D/ wt bearing exercise and bone density test every 2 y to monitor Disc safety/ fracture risk in detail   Will inc D to 4000-5000 iu daily  Also start ca  No falls or fx Re check 2 y  Other   Routine general medical examination at a health care facility - Primary    Reviewed health habits including diet and exercise and skin cancer prevention Reviewed appropriate screening tests for age  Also reviewed health mt list, fam hx and immunization status , as well as social and family history   See HPI Labs reviewed Let's keep an eye on your blood pressure  Eat healthy / low sodium/ look at the [facility] handout  Exercise as much as you can  You will be due for a colonoscopy in 7/20  Calcium- get 600 mg twice daily  Vitamin D -total 4000-5000 iu daily - for bone and overall health      Vitamin D deficiency    Level is in 56s Not taking much  Will inc to 4000-5000 iu daily  Has osteopenia Disc imp of this to bone and overall health       Other Visit Diagnoses    Need for influenza vaccination        Relevant Orders    Flu Vaccine QUAD 36+ mos PF IM (Fluarix & Fluzone Quad PF) (Completed)

## 2015-07-05 NOTE — Progress Notes (Signed)
Pre visit review using our clinic review tool, if applicable. No additional management support is needed unless otherwise documented below in the visit note. 

## 2015-07-05 NOTE — Assessment & Plan Note (Signed)
Reviewed health habits including diet and exercise and skin cancer prevention Reviewed appropriate screening tests for age  Also reviewed health mt list, fam hx and immunization status , as well as social and family history   See HPI Labs reviewed Let's keep an eye on your blood pressure  Eat healthy / low sodium/ look at the [facility] handout  Exercise as much as you can  You will be due for a colonoscopy in 7/20  Calcium- get 600 mg twice daily  Vitamin D -total 4000-5000 iu daily - for bone and overall health

## 2015-07-05 NOTE — Assessment & Plan Note (Signed)
Rev dexa from this year - with FN -1.7 lowest T score  Disc need for calcium/ vitamin D/ wt bearing exercise and bone density test every 2 y to monitor Disc safety/ fracture risk in detail   Will inc D to 4000-5000 iu daily  Also start ca  No falls or fx Re check 2 y

## 2015-07-05 NOTE — Patient Instructions (Signed)
Let's keep an eye on your blood pressure  Eat healthy / low sodium/ look at the [facility] handout  Exercise as much as you can  You will be due for a colonoscopy in 7/20  Calcium- get 600 mg twice daily  Vitamin D -total 4000-5000 iu daily - for bone and overall health

## 2015-07-22 ENCOUNTER — Telehealth: Payer: Self-pay | Admitting: Family Medicine

## 2015-07-22 DIAGNOSIS — R430 Anosmia: Secondary | ICD-10-CM

## 2015-07-22 NOTE — Telephone Encounter (Signed)
Pt called. Would like ENT referral. Would like to go to [facility]. Can go anytime. Prefers mornings.Please advise

## 2015-07-23 NOTE — Telephone Encounter (Signed)
Ref done will route to [place]  

## 2015-07-26 NOTE — Telephone Encounter (Signed)
Patient notified as instructed by telephone and verbalized understanding. 

## 2015-08-23 ENCOUNTER — Other Ambulatory Visit: Payer: Managed Care, Other (non HMO)

## 2015-08-26 ENCOUNTER — Encounter: Payer: Managed Care, Other (non HMO) | Admitting: Family Medicine

## 2015-09-21 ENCOUNTER — Other Ambulatory Visit: Payer: Self-pay | Admitting: Family Medicine

## 2015-09-22 ENCOUNTER — Telehealth: Payer: Self-pay | Admitting: Family Medicine

## 2015-09-22 NOTE — Telephone Encounter (Signed)
Pt said she went to a UC back in 2015 for a viral URI when we were closed and they gave her this Rx, pt is starting to have sxs of a URI again and she asked the pharmacy to fill Rx but since it's expired and Rx came from an UC they advise her they can't send the UC the refill request but they can send it to [place] since we are her PCP. Pt said she called here and the triage nurse advise her that she just has a cold and she didn't need to come in for an appt. Pt is requesting the med refilled or a similar med to this filled. Pt said she has a cough, and a little congestion, but no fever, no chills, and no body aches. Pt said her main problem is that she is coughing really bad and it's to the point it's keeping her up at night so she isn't sleeping

## 2015-09-22 NOTE — Telephone Encounter (Signed)
Addressed through results notes  

## 2015-09-22 NOTE — Telephone Encounter (Signed)
I cannot fill that w/o seeing her  She can try mucinex DM otc F/u if no improved

## 2015-09-22 NOTE — Telephone Encounter (Signed)
Rx not on med list and I don't think you have prescribed this Rx before, please advise

## 2015-09-22 NOTE — Telephone Encounter (Signed)
Does not look familiar- please ask her about it

## 2015-09-22 NOTE — Telephone Encounter (Signed)
Left voicemail requesting pt to call office back 

## 2015-09-22 NOTE — Telephone Encounter (Signed)
Pt returned your call.  

## 2015-09-23 ENCOUNTER — Other Ambulatory Visit: Payer: Self-pay | Admitting: Family Medicine

## 2015-09-23 NOTE — Telephone Encounter (Signed)
Pt notified of Dr. Marliss Coots comments and recommendations. Pt will try Mucinex DM and if no improvement will schedule a f/u, Rx declined

## 2016-04-16 ENCOUNTER — Ambulatory Visit (INDEPENDENT_AMBULATORY_CARE_PROVIDER_SITE_OTHER): Payer: Managed Care, Other (non HMO) | Admitting: Family Medicine

## 2016-04-16 ENCOUNTER — Encounter: Payer: Self-pay | Admitting: Family Medicine

## 2016-04-16 VITALS — BP 132/90 | HR 74 | Temp 98.2°F | Ht 67.0 in | Wt 184.2 lb

## 2016-04-16 DIAGNOSIS — J029 Acute pharyngitis, unspecified: Secondary | ICD-10-CM

## 2016-04-16 LAB — POCT RAPID STREP A (OFFICE): RAPID STREP A SCREEN: NEGATIVE

## 2016-04-16 NOTE — Progress Notes (Signed)
Pre visit review using our clinic review tool, if applicable. No additional management support is needed unless otherwise documented below in the visit note. 

## 2016-04-16 NOTE — Progress Notes (Signed)
Subjective:    Patient ID: Deborah Orozco, female    DOB: 10/02/1955, 60 y.o.   MRN: [id number]  HPI Here for ST Started on Saturday  Is sharp in nature-in the middle  No pnd  Is tired -slept all day yesterday Felt feverish/achey/clammy -and advil otc helps   Has been traveling/on plane  Is about to go to a family reunion   Ears ache a little  No nasal symptoms  A little cough when she lies down     Results for orders placed or performed in visit on 04/16/16  Rapid Strep A  Result Value Ref Range   Rapid Strep A Screen Negative Negative     Patient Active Problem List   Diagnosis Date Noted  . Viral pharyngitis 04/16/2016  . 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 Osteopenia 07/05/2015  . Family history of colon cancer 11/20/2013  . Actinic keratosis 12/12/2012  . Anemia 07/09/2012  . Colon cancer screening 07/09/2012  . Breast screening, unspecified 04/08/2012  . Routine general medical examination at a health care facility 12/20/2011  . Vitamin D deficiency 03/14/2010   Past Medical History:  Diagnosis Date  . Post-menopausal    Past Surgical History:  Procedure Laterality Date  . NO PAST SURGERIES     Social History  Substance Use Topics  . Smoking status: Never Smoker  . Smokeless tobacco: Never Used  . Alcohol use 1.2 oz/week    2 Glasses of wine per week     Comment: occ   Family History  Problem Relation Age of Onset  . Cancer Mother 46    Breast twice recurred at 46/colon/liver  . Diabetes Mother   . Hypertension Mother   . Colon cancer Mother 87  . Hypertension Father   . Heart disease Brother   . Cancer Maternal Grandfather     possible colon cancer unsure of exact location gastro/tn   No Known Allergies Current Outpatient Prescriptions on File Prior to Visit  Medication Sig Dispense Refill  . Melatonin 3 MG TABS Take 1 tablet by mouth as needed. At bedtime    . Multiple Vitamin (MULTIVITAMIN) capsule Take by mouth. HERBALIFE     No  current facility-administered medications on file prior to visit.     Review of Systems    Review of Systems  Constitutional: Negative for fever, appetite change, fatigue and unexpected weight change.  Eyes: Negative for pain and visual disturbance.  ENT pos for ST , neg for sinus pain or facial pain  Respiratory: Negative for cough and shortness of breath.   Cardiovascular: Negative for cp or palpitations    Gastrointestinal: Negative for nausea, diarrhea and constipation.  Genitourinary: Negative for urgency and frequency.  Skin: Negative for pallor or rash  neg for tick bite  Neurological: Negative for weakness, light-headedness, numbness and headaches.  Hematological: Negative for adenopathy. Does not bruise/bleed easily.  Psychiatric/Behavioral: Negative for dysphoric mood. The patient is not nervous/anxious.      Objective:   Physical Exam  Constitutional: She appears well-developed and well-nourished. No distress.  HENT:  Head: Normocephalic and atraumatic.  Right Ear: External ear normal.  Left Ear: External ear normal.  Mouth/Throat: Oropharynx is clear and moist. No oropharyngeal exudate.  Nares are boggy Scant clear pnd  Throat is mildly injected/posterior w/o swelling or exudate or ulcers  No sinus tenderness  Eyes: Conjunctivae and EOM are normal. Pupils are equal, round, and reactive to light. Right eye exhibits no discharge. Left eye  exhibits no discharge.  Neck: Normal range of motion. Neck supple.  Cardiovascular: Normal rate and regular rhythm.   Pulmonary/Chest: Effort normal and breath sounds normal. No respiratory distress. She has no wheezes. She has no rales.  Lymphadenopathy:    She has no cervical adenopathy.  Neurological: She is alert. No cranial nerve deficit.  Skin: Skin is warm and dry. No rash noted.  Psychiatric: She has a normal mood and affect.          Assessment & Plan:   Problem List Items Addressed This Visit      Respiratory    Viral pharyngitis    Neg RST Disc symptomatic care - see instructions on AVS  Suspect this could develop into a uri Reassuring exam  Update if not starting to improve in a week or if worsening           Other Visit Diagnoses    Sore throat    -  Primary   Relevant Orders   Rapid Strep A (Completed)

## 2016-04-16 NOTE — Assessment & Plan Note (Signed)
Neg RST Disc symptomatic care - see instructions on AVS  Suspect this could develop into a uri Reassuring exam  Update if not starting to improve in a week or if worsening

## 2016-04-16 NOTE — Patient Instructions (Signed)
I think you have a viral sore throat or the start of a cold  Drink lots of fluids  Gargle with salt water whenever you can  Lozenges are ok  Chloraseptic throat spray is helpful advil or aleve (do not mix them)  Try to get some extra rest  Update if not starting to improve in a week or if worsening

## 2016-06-24 ENCOUNTER — Telehealth: Payer: Self-pay | Admitting: Family Medicine

## 2016-06-24 DIAGNOSIS — Z0000 Encounter for general adult medical examination without abnormal findings: Secondary | ICD-10-CM

## 2016-06-24 DIAGNOSIS — E559 Vitamin D deficiency, unspecified: Secondary | ICD-10-CM

## 2016-06-24 NOTE — Telephone Encounter (Signed)
-----   Message from Ellamae Sia sent at 06/21/2016  9:56 AM EST ----- Regarding: Lab orders for Friday, 12.8.17 Patient is scheduled for CPX labs, please order future labs, Thanks , Karna Christmas

## 2016-06-29 ENCOUNTER — Other Ambulatory Visit: Payer: Managed Care, Other (non HMO)

## 2016-07-06 ENCOUNTER — Encounter: Payer: Managed Care, Other (non HMO) | Admitting: Family Medicine

## 2016-08-28 ENCOUNTER — Ambulatory Visit (INDEPENDENT_AMBULATORY_CARE_PROVIDER_SITE_OTHER): Payer: Managed Care, Other (non HMO) | Admitting: Emergency Medicine

## 2016-08-28 DIAGNOSIS — Z23 Encounter for immunization: Secondary | ICD-10-CM

## 2017-03-21 ENCOUNTER — Encounter: Payer: Self-pay | Admitting: Internal Medicine

## 2017-03-21 ENCOUNTER — Ambulatory Visit (INDEPENDENT_AMBULATORY_CARE_PROVIDER_SITE_OTHER): Payer: Managed Care, Other (non HMO) | Admitting: Internal Medicine

## 2017-03-21 VITALS — BP 134/84 | HR 76 | Temp 97.8°F | Wt 184.0 lb

## 2017-03-21 DIAGNOSIS — R52 Pain, unspecified: Secondary | ICD-10-CM | POA: Diagnosis not present

## 2017-03-21 DIAGNOSIS — R5383 Other fatigue: Secondary | ICD-10-CM | POA: Diagnosis not present

## 2017-03-21 LAB — COMPREHENSIVE METABOLIC PANEL
ALT: 22 U/L (ref 0–35)
AST: 17 U/L (ref 0–37)
Albumin: 4.4 g/dL (ref 3.5–5.2)
Alkaline Phosphatase: 68 U/L (ref 39–117)
BILIRUBIN TOTAL: 0.7 mg/dL (ref 0.2–1.2)
BUN: 14 mg/dL (ref 6–23)
CHLORIDE: 108 meq/L (ref 96–112)
CO2: 29 meq/L (ref 19–32)
Calcium: 9.5 mg/dL (ref 8.4–10.5)
Creatinine, Ser: 0.8 mg/dL (ref 0.40–1.20)
GFR: 77.97 mL/min (ref >60.00)
GLUCOSE: 94 mg/dL (ref 70–99)
POTASSIUM: 4.5 meq/L (ref 3.5–5.1)
Sodium: 142 mEq/L (ref 135–145)
Total Protein: 6.5 g/dL (ref 6.0–8.3)

## 2017-03-21 LAB — MONONUCLEOSIS SCREEN: Mono Screen: NEGATIVE

## 2017-03-21 LAB — VITAMIN B12: VITAMIN B 12: 795 pg/mL (ref 211–911)

## 2017-03-21 LAB — CBC
HCT: 39.8 % (ref 36.0–46.0)
HEMOGLOBIN: 12.7 g/dL (ref 12.0–15.0)
MCHC: 32 g/dL (ref 30.0–36.0)
MCV: 83.5 fl (ref 78.0–100.0)
Platelets: 251 10*3/uL (ref 150.0–400.0)
RBC: 4.76 Mil/uL (ref 3.87–5.11)
RDW: 15 % (ref 11.5–15.5)
WBC: 6.2 10*3/uL (ref 4.0–10.5)

## 2017-03-21 LAB — VITAMIN D 25 HYDROXY (VIT D DEFICIENCY, FRACTURES): VITD: 39.03 ng/mL (ref 30.00–100.00)

## 2017-03-21 LAB — TSH: TSH: 2.28 u[IU]/mL (ref 0.35–4.50)

## 2017-03-21 NOTE — Patient Instructions (Signed)

## 2017-03-21 NOTE — Progress Notes (Signed)
Subjective:    Patient ID: Deborah Orozco, female    DOB: 1956/01/17, 61 y.o.   MRN: [id number]  HPI  Pt presents to the clinic today with c/o headache, nasal congestion, fatigue and body aches. She reports this started 2 weeks ago. She describes the headache as pressure. She denies visual changes or dizziness. She is not blowing anything out of her nose. She denies ear pain, sore throat or cough. She denies fever or chills. She has taken Claritin with minimal relief. She has not had sick contacts. She denies changes in diet, medication. She denies recent increase in stress. She denies anxiety and depression.   Review of Systems      Past Medical History:  Diagnosis Date  . Post-menopausal     Current Outpatient Prescriptions  Medication Sig Dispense Refill  . Cholecalciferol (VITAMIN D-3) 5000 units TABS Take 1 capsule by mouth daily.    . fexofenadine (ALLEGRA) 180 MG tablet Take 180 mg by mouth daily.    . Multiple Vitamin (MULTIVITAMIN) capsule Take by mouth. HERBALIFE    . Multiple Vitamins-Minerals (MULTIVITAMIN WOMEN PO) Take 1 capsule by mouth daily.    . Melatonin 3 MG TABS Take 1 tablet by mouth as needed. At bedtime     No current facility-administered medications for this visit.     No Known Allergies  Family History  Problem Relation Age of Onset  . Cancer Mother 72       Breast twice recurred at 46/colon/liver  . Diabetes Mother   . Hypertension Mother   . Colon cancer Mother 44  . Hypertension Father   . Heart disease Brother   . Cancer Maternal Grandfather        possible colon cancer unsure of exact location gastro/tn    Social History   Social History  . Marital status: Married    Spouse name: N/A  . Number of children: N/A  . Years of education: N/A   Occupational History  . Not on file.   Social History Main Topics  . Smoking status: Never Smoker  . Smokeless tobacco: Never Used  . Alcohol use 1.2 oz/week    2 Glasses of wine per week       Comment: occ  . Drug use: No  . Sexual activity: Yes    Partners: Male    Birth control/ protection: Post-menopausal   Other Topics Concern  . Not on file   Social History Narrative  . No narrative on file     Constitutional: Pt reports fatigue, headache. Denies fever, malaise, or abrupt weight changes.  HEENT: Pt reports nasal congestion. Denies eye pain, eye redness, ear pain, ringing in the ears, wax buildup, runny nose, bloody nose, or sore throat. Respiratory: Denies difficulty breathing, shortness of breath, cough or sputum production.   Cardiovascular: Denies chest pain, chest tightness, palpitations or swelling in the hands or feet.  Gastrointestinal: Denies abdominal pain, bloating, constipation, diarrhea or blood in the stool.  GU: Denies urgency, frequency, pain with urination, burning sensation, blood in urine, odor or discharge. Musculoskeletal: Pt reports body aches. Denies decrease in range of motion, difficulty with gait, or joint pain and swelling.  Skin: Denies redness, rashes, lesions or ulcercations.  Neurological: Denies dizziness, difficulty with memory, difficulty with speech or problems with balance and coordination.  Psych: Denies anxiety, depression, SI/HI.  No other specific complaints in a complete review of systems (except as listed in HPI above).  Objective:   Physical Exam  BP 134/84   Pulse 76   Temp 97.8 F (36.6 C) (Oral)   Wt 184 lb (83.5 kg)   SpO2 98%   BMI 28.82 kg/m  Wt Readings from Last 3 Encounters:  03/21/17 184 lb (83.5 kg)  04/16/16 184 lb 4 oz (83.6 kg)  07/05/15 184 lb 8 oz (83.7 kg)    General: Appears her stated age,  in NAD. Skin: Warm, dry and intact. No rashes noted. HEENT: Ears: Tm's gray and intact, normal light reflex; Throat/Mouth: Teeth present, mucosa pink and moist, no exudate, lesions or ulcerations noted.  Neck:  Neck supple, trachea midline. No masses, lumps or thyromegaly present.  Cardiovascular:  Normal rate and rhythm.  Pulmonary/Chest: Normal effort and positive vesicular breath sounds. No respiratory distress. No wheezes, rales or ronchi noted.  Neurological: Alert and oriented.   BMET    Component Value Date/Time   NA 140 06/28/2015 0913   K 4.0 06/28/2015 0913   CL 106 06/28/2015 0913   CO2 29 06/28/2015 0913   GLUCOSE 90 06/28/2015 0913   BUN 15 06/28/2015 0913   CREATININE 0.84 06/28/2015 0913   CREATININE 0.77 06/06/2012 1532   CALCIUM 9.3 06/28/2015 0913   GFRNONAA 91.81 03/14/2010 1158    Lipid Panel     Component Value Date/Time   CHOL 187 06/28/2015 0913   TRIG 88.0 06/28/2015 0913   HDL 63.00 06/28/2015 0913   CHOLHDL 3 06/28/2015 0913   VLDL 17.6 06/28/2015 0913   LDLCALC 107 (H) 06/28/2015 0913    CBC    Component Value Date/Time   WBC 5.8 06/28/2015 0913   RBC 4.70 06/28/2015 0913   HGB 13.7 06/28/2015 0913   HCT 40.8 06/28/2015 0913   PLT 247.0 06/28/2015 0913   MCV 86.9 06/28/2015 0913   MCH 26.7 06/06/2012 1532   MCHC 33.5 06/28/2015 0913   RDW 13.5 06/28/2015 0913   LYMPHSABS 1.6 06/28/2015 0913   MONOABS 0.5 06/28/2015 0913   EOSABS 0.4 06/28/2015 0913   BASOSABS 0.0 06/28/2015 0913    Hgb A1C No results found for: HGBA1C         Assessment & Plan:   Fatigue and Body Aches:  Exam benign Will check CBC, CMET, TSH, B12, Vit D, mono spot and B Burgdorferi  Will follow up after labs, return precautions discussed Webb Silversmith, NP

## 2017-03-22 LAB — LYME AB/WESTERN BLOT REFLEX

## 2017-04-01 ENCOUNTER — Encounter: Payer: Self-pay | Admitting: Family Medicine

## 2017-04-01 ENCOUNTER — Ambulatory Visit (INDEPENDENT_AMBULATORY_CARE_PROVIDER_SITE_OTHER): Payer: Managed Care, Other (non HMO) | Admitting: Family Medicine

## 2017-04-01 VITALS — BP 110/68 | HR 67 | Temp 98.3°F | Ht 67.0 in | Wt 183.0 lb

## 2017-04-01 DIAGNOSIS — R5382 Chronic fatigue, unspecified: Secondary | ICD-10-CM

## 2017-04-01 DIAGNOSIS — Z23 Encounter for immunization: Secondary | ICD-10-CM | POA: Diagnosis not present

## 2017-04-01 DIAGNOSIS — R430 Anosmia: Secondary | ICD-10-CM | POA: Diagnosis not present

## 2017-04-01 DIAGNOSIS — R5383 Other fatigue: Secondary | ICD-10-CM | POA: Insufficient documentation

## 2017-04-01 DIAGNOSIS — E559 Vitamin D deficiency, unspecified: Secondary | ICD-10-CM

## 2017-04-01 NOTE — Assessment & Plan Note (Signed)
Pt saw Dr Richardson Landry from ENT No imp with flonase  She did not f/u  Symptoms still present /not worse  Per note-consider MRI (several years ago) Pt continues to monitor this  Enc her to alert [place] if symptoms worsen or if she wants to proceed with this

## 2017-04-01 NOTE — Assessment & Plan Note (Signed)
With inc aches and pains in shoulder girdle and back  This is starting to improve and she is back to the gym Disc poss of depression vs a stress let down syndrome (was prev very stressed at work)  [employer] rev  Continue to follow- no other w/u if she continues to improve  Reviewed stressors/ coping techniques/symptoms/ support sources/ tx options and side effects in detail today  She seems motivated and not overtly depressed  Would consider esr if body aches return  Will enc gradual return to exercise and nl activities with good health habits

## 2017-04-01 NOTE — Assessment & Plan Note (Signed)
Vitamin D level is therapeutic with current supplementation Disc importance of this to bone and overall health She will continue 5000 iu daily

## 2017-04-01 NOTE — Progress Notes (Signed)
Subjective:    Patient ID: Deborah Orozco, female    DOB: 26-Nov-1955, 61 y.o.   MRN: [id number]  HPI Here for f/u of chronic medical problems    Wt Readings from Last 3 Encounters:  04/01/17 183 lb (83 kg)  03/21/17 184 lb (83.5 kg)  04/16/16 184 lb 4 oz (83.6 kg)   28.66 kg/m  Had labs with NP Baity for fatigue recently  She was really tired and achey and struggling - for 2 weeks  Then labs were re assuring   Hx of D def- level is good on 5000 iu   Office Visit on 03/21/2017  Component Date Value Ref Range Status  . WBC 03/21/2017 6.2  4.0 - 10.5 K/uL Final  . RBC 03/21/2017 4.76  3.87 - 5.11 Mil/uL Final  . Platelets 03/21/2017 251.0  150.0 - 400.0 K/uL Final  . Hemoglobin 03/21/2017 12.7  12.0 - 15.0 g/dL Final  . HCT 03/21/2017 39.8  36.0 - 46.0 % Final  . MCV 03/21/2017 83.5  78.0 - 100.0 fl Final  . MCHC 03/21/2017 32.0  30.0 - 36.0 g/dL Final  . RDW 03/21/2017 15.0  11.5 - 15.5 % Final  . Sodium 03/21/2017 142  135 - 145 mEq/L Final  . Potassium 03/21/2017 4.5  3.5 - 5.1 mEq/L Final  . Chloride 03/21/2017 108  96 - 112 mEq/L Final  . CO2 03/21/2017 29  19 - 32 mEq/L Final  . Glucose, Bld 03/21/2017 94  70 - 99 mg/dL Final  . BUN 03/21/2017 14  6 - 23 mg/dL Final  . Creatinine, Ser 03/21/2017 0.80  0.40 - 1.20 mg/dL Final  . Total Bilirubin 03/21/2017 0.7  0.2 - 1.2 mg/dL Final  . Alkaline Phosphatase 03/21/2017 68  39 - 117 U/L Final  . AST 03/21/2017 17  0 - 37 U/L Final  . ALT 03/21/2017 22  0 - 35 U/L Final  . Total Protein 03/21/2017 6.5  6.0 - 8.3 g/dL Final  . Albumin 03/21/2017 4.4  3.5 - 5.2 g/dL Final  . Calcium 03/21/2017 9.5  8.4 - 10.5 mg/dL Final  . GFR 03/21/2017 77.97  >60.00 mL/min Final  . TSH 03/21/2017 2.28  0.35 - 4.50 uIU/mL Final  . Vitamin B-12 03/21/2017 795  211 - 911 pg/mL Final  . VITD 03/21/2017 39.03  30.00 - 100.00 ng/mL Final  . Mono Screen 03/21/2017 Negative  Negative Final  . B burgdorferi Ab IgG+IgM 03/21/2017 <0.90   <0.90 Index Final   Comment:                      Index                Interpretation                    -----                --------------                    < 0.90               NEGATIVE                    0.90-1.09            EQUIVOCAL                    > 1.09  POSITIVE   As recommended by the Food and Drug Administration (FDA), all samples with positive or equivocal results on the B. burgdorferi antibody screen will be tested by Western Blot/Immunoblot. Positive or equivocal screening test results should not be interpreted as truly positive until verified as such using a supplemental assay (e.g., B. burgdorferi blot).   The screening test and/or blot for B. burgdorferi antibodies may be falsely negative in early stages of Lyme disease, including the period when erythema migrans is apparent.       She has tried to push through the pain and fatigue   Was sleeping 9-10 hours per night   Pain is significant  Has always exercised/walked - went back to walking every day and back to the gym  Knees hurt- but not as much  Some improvement  Aches are improved except for the knees    When asked about depression - ? Unsure  No hx in the family   Wondered about "burn out"  Work has been really stressful -until recently -new hires / more help  Nothing wrong - but expected to feel better  No unresolved issues   She recently cut out artificial sweeteners and sugar  BP Readings from Last 3 Encounters:  04/01/17 110/68  03/21/17 134/84  04/16/16 132/90    Patient Active Problem List   Diagnosis Date Noted  . Fatigue 04/01/2017  . 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 Osteopenia 07/05/2015  . Family history of colon cancer 11/20/2013  . Actinic keratosis 12/12/2012  . Anemia 07/09/2012  . Colon cancer screening 07/09/2012  . Breast screening, unspecified 04/08/2012  . Routine general medical examination at a health care facility 12/20/2011  . Vitamin D  deficiency 03/14/2010   Past Medical History:  Diagnosis Date  . Post-menopausal    Past Surgical History:  Procedure Laterality Date  . NO PAST SURGERIES     Social History  Substance Use Topics  . Smoking status: Never Smoker  . Smokeless tobacco: Never Used  . Alcohol use 1.2 oz/week    2 Glasses of wine per week     Comment: occ   Family History  Problem Relation Age of Onset  . Cancer Mother 80       Breast twice recurred at 46/colon/liver  . Diabetes Mother   . Hypertension Mother   . Colon cancer Mother 4  . Hypertension Father   . Heart disease Brother   . Cancer Maternal Grandfather        possible colon cancer unsure of exact location gastro/tn   No Known Allergies Current Outpatient Prescriptions on File Prior to Visit  Medication Sig Dispense Refill  . Cholecalciferol (VITAMIN D-3) 5000 units TABS Take 1 capsule by mouth daily.    . fexofenadine (ALLEGRA) 180 MG tablet Take 180 mg by mouth daily.    . Multiple Vitamin (MULTIVITAMIN) capsule Take by mouth. HERBALIFE     No current facility-administered medications on file prior to visit.      Review of Systems  Constitutional: Positive for fatigue. Negative for activity change, appetite change, fever and unexpected weight change.  HENT: Negative for congestion, ear pain, rhinorrhea, sinus pressure and sore throat.   Eyes: Negative for pain, redness and visual disturbance.  Respiratory: Negative for cough, shortness of breath and wheezing.   Cardiovascular: Negative for chest pain and palpitations.  Gastrointestinal: Negative for abdominal pain, blood in stool, constipation and diarrhea.  Endocrine: Negative for polydipsia and polyuria.  Genitourinary: Negative for  dysuria, frequency and urgency.  Musculoskeletal: Positive for myalgias. Negative for arthralgias and back pain.  Skin: Negative for pallor and rash.  Allergic/Immunologic: Negative for environmental allergies.  Neurological: Negative for  dizziness, syncope and headaches.  Hematological: Negative for adenopathy. Does not bruise/bleed easily.  Psychiatric/Behavioral: Negative for decreased concentration, dysphoric mood, sleep disturbance and suicidal ideas. The patient is not nervous/anxious.        Objective:   Physical Exam  Constitutional: She appears well-developed and well-nourished. No distress.  Well appearing   HENT:  Head: Normocephalic and atraumatic.  Right Ear: External ear normal.  Left Ear: External ear normal.  Nose: Nose normal.  Mouth/Throat: Oropharynx is clear and moist.  Eyes: Pupils are equal, round, and reactive to light. Conjunctivae and EOM are normal.  Neck: Normal range of motion. Neck supple. No JVD present. Carotid bruit is not present. No thyromegaly present.  Cardiovascular: Normal rate, regular rhythm, normal heart sounds and intact distal pulses.  Exam reveals no gallop.   Pulmonary/Chest: Effort normal and breath sounds normal. No respiratory distress. She has no wheezes. She has no rales.  No crackles  Abdominal: Soft. Bowel sounds are normal. She exhibits no distension, no abdominal bruit and no mass. There is no tenderness.  Musculoskeletal: She exhibits no edema.  Mild myofascial tenderness in shoulders/upper arms and thighs No joint swelling or crepitus    Lymphadenopathy:    She has no cervical adenopathy.  Neurological: She is alert. She has normal reflexes. She displays no tremor. No cranial nerve deficit. She exhibits normal muscle tone. Coordination normal.  Skin: Skin is warm and dry. No rash noted. No erythema. No pallor.  Psychiatric: She has a normal mood and affect. Her speech is normal and behavior is normal. Thought content normal. Her mood appears not anxious. Her affect is not blunt and not labile. Cognition and memory are normal. She does not exhibit a depressed mood.  Mood is generally good despite fatigue  Does not seem depressed or anxious             Assessment & Plan:   Problem List Items Addressed This Visit      Other   Fatigue    With inc aches and pains in shoulder girdle and back  This is starting to improve and she is back to the gym Disc poss of depression vs a stress let down syndrome (was prev very stressed at work)  [employer] rev  Continue to follow- no other w/u if she continues to improve  Reviewed stressors/ coping techniques/symptoms/ support sources/ tx options and side effects in detail today  She seems motivated and not overtly depressed  Would consider esr if body aches return  Will enc gradual return to exercise and nl activities with good health habits       Loss, sense of, smell    Pt saw Dr Richardson Landry from ENT No imp with flonase  She did not f/u  Symptoms still present /not worse  Per note-consider MRI (several years ago) Pt continues to monitor this  Enc her to alert [place] if symptoms worsen or if she wants to proceed with this       Vitamin D deficiency - Primary    Vitamin D level is therapeutic with current supplementation Disc importance of this to bone and overall health She will continue 5000 iu daily       Other Visit Diagnoses    Need for influenza vaccination  Relevant Orders   Flu Vaccine QUAD 6+ mos PF IM (Fluarix Quad PF) (Completed)

## 2017-04-01 NOTE — Patient Instructions (Signed)
Flu shot today  Labs are re assuring  Glad you are starting to improve  If symptoms worsen or not continue to improve let us know   Continue to gradually advance to regular activity  Cross train for exercise to prevent injury   Keep [place] posted

## 2017-08-12 ENCOUNTER — Ambulatory Visit: Payer: Self-pay | Admitting: *Deleted

## 2017-08-12 NOTE — Telephone Encounter (Signed)
Okay Will assess at OV 

## 2017-08-12 NOTE — Telephone Encounter (Signed)
Pt reports B/P last Wednesday at blood drive was 915/056. Checked at [facility] yesterday afternoon 130/104. Reports mild headache, 2-3/10, no other symptoms. Not taking any medications, reports some stress lately at work. Practice called pt directly to schedule appt for tomorrow AM.  Reason for Disposition . Systolic BP  >= 979 OR Diastolic >= 480  Answer Assessment - Initial Assessment Questions 1. BLOOD PRESSURE: "What is the blood pressure?" "Did you take at least two measurements 5 minutes apart?"    130/104 yesterday afternoon.  No. 2. ET: "When did you take your blood pressure?" Yesterday afternoon, 08/11/17     3. HOW: "How did you obtain the blood pressure?" (e.g., visiting nurse, automatic home BP monitor)     [facility] 4. HISTORY: "Do you have a history of high blood pressure?"     No 5. MEDICATIONS: "Are you taking any medications for blood pressure?" "Have you missed any doses recently?"     No meds 6. OTHER SYMPTOMS: "Do you have any symptoms?" (e.g., headache, chest pain, blurred vision, difficulty breathing, weakness)     Headache, "pressure behind eyes, but I have chronic sinus problems."  2-3/10 7. PREGNANCY: "Is there any chance you are pregnant?" "When was your last menstrual period?"     no  Protocols used: HIGH BLOOD PRESSURE-A-AH

## 2017-08-12 NOTE — Telephone Encounter (Signed)
I spoke with Deborah Orozco and offered multiple appt times today but Deborah Orozco wanted to be seen on 08/13/17. Deborah Orozco scheduled appt with Dr Silvio Pate on 09/13/17 at 8 AM. The only appt was convenient for pts schedule. If Deborah Orozco condition changes or worsens prior to appt Deborah Orozco will cb or go to UC.FYI to Dr Silvio Pate.

## 2017-08-13 ENCOUNTER — Ambulatory Visit: Payer: Managed Care, Other (non HMO) | Admitting: Internal Medicine

## 2017-08-13 ENCOUNTER — Encounter: Payer: Self-pay | Admitting: Internal Medicine

## 2017-08-13 VITALS — BP 142/98 | HR 72 | Temp 98.2°F | Wt 183.5 lb

## 2017-08-13 DIAGNOSIS — R030 Elevated blood-pressure reading, without diagnosis of hypertension: Secondary | ICD-10-CM

## 2017-08-13 LAB — POC URINALSYSI DIPSTICK (AUTOMATED)
Bilirubin, UA: NEGATIVE
Blood, UA: NEGATIVE
Glucose, UA: NEGATIVE
Ketones, UA: NEGATIVE
LEUKOCYTES UA: NEGATIVE
NITRITE UA: NEGATIVE
PH UA: 6 (ref 5.0–8.0)
PROTEIN UA: NEGATIVE
Spec Grav, UA: 1.01 (ref 1.010–1.025)
Urobilinogen, UA: 0.2 E.U./dL

## 2017-08-13 NOTE — Progress Notes (Signed)
   Subjective:    Patient ID: Deborah Orozco, female    DOB: 1955/11/01, 62 y.o.   MRN: [id number]  HPI Here due to elevated blood pressure  About 10 days ago--went to give blood (she does this regularly) BP 140/108 repeated---she was deferred Went 2 days ago--- about 140/106 No prior problems with BP--but has been borderline Usually lifestyle changes will help Up 5# or so from baseline  Has had some mild headache--?sinus No chest pain or SOB Exercises regularly--but less lately No edema  Current Outpatient Medications on File Prior to Visit  Medication Sig Dispense Refill  . Cholecalciferol (VITAMIN D-3) 5000 units TABS Take 1 capsule by mouth daily.    . fexofenadine (ALLEGRA) 180 MG tablet Take 180 mg by mouth daily.    . Multiple Vitamin (MULTIVITAMIN) capsule Take by mouth. HERBALIFE     No current facility-administered medications on file prior to visit.     No Known Allergies  Past Medical History:  Diagnosis Date  . Post-menopausal     Past Surgical History:  Procedure Laterality Date  . NO PAST SURGERIES      Family History  Problem Relation Age of Onset  . Cancer Mother 22       Breast twice recurred at 46/colon/liver  . Diabetes Mother   . Hypertension Mother   . Colon cancer Mother 26  . Hypertension Father   . Heart disease Brother   . Cancer Maternal Grandfather        possible colon cancer unsure of exact location gastro/tn    Social History   Socioeconomic History  . Marital status: Married    Spouse name: Not on file  . Number of children: Not on file  . Years of education: Not on file  . Highest education level: Not on file  Social Needs  . Financial resource strain: Not on file  . Food insecurity - worry: Not on file  . Food insecurity - inability: Not on file  . Transportation needs - medical: Not on file  . Transportation needs - non-medical: Not on file  Occupational History  . Not on file  Tobacco Use  . Smoking status:  Never Smoker  . Smokeless tobacco: Never Used  Substance and Sexual Activity  . Alcohol use: Yes    Alcohol/week: 1.2 oz    Types: 2 Glasses of wine per week    Comment: occ  . Drug use: No  . Sexual activity: Yes    Partners: Male    Birth control/protection: Post-menopausal  Other Topics Concern  . Not on file  Social History Narrative  . Not on file   Review of Systems No preeclampsia Sleeping well--- has been tired Lots of stress at [facility] a little down but nothing serious (finance)    Objective:   Physical Exam  Constitutional: She appears well-developed. No distress.  Neck: No thyromegaly present.  Cardiovascular: Normal rate, regular rhythm and normal heart sounds. Exam reveals no gallop.  No murmur heard. Pulmonary/Chest: Effort normal and breath sounds normal. No respiratory distress. She has no wheezes. She has no rales.  Musculoskeletal: She exhibits no edema.  Lymphadenopathy:    She has no cervical adenopathy.          Assessment & Plan:

## 2017-08-13 NOTE — Patient Instructions (Signed)
DASH Eating Plan DASH stands for "Dietary Approaches to Stop Hypertension." The DASH eating plan is a healthy eating plan that has been shown to reduce high blood pressure (hypertension). It may also reduce your risk for type 2 diabetes, heart disease, and stroke. The DASH eating plan may also help with weight loss. What are tips for following this plan? General guidelines  Avoid eating more than 2,300 mg (milligrams) of salt (sodium) a day. If you have hypertension, you may need to reduce your sodium intake to 1,500 mg a day.  Limit alcohol intake to no more than 1 drink a day for nonpregnant women and 2 drinks a day for men. One drink equals 12 oz of beer, 5 oz of wine, or 1 oz of hard liquor.  Work with your health care provider to maintain a healthy body weight or to lose weight. Ask what an ideal weight is for you.  Get at least 30 minutes of exercise that causes your heart to beat faster (aerobic exercise) most days of the week. Activities may include walking, swimming, or biking.  Work with your health care provider or diet and nutrition specialist (dietitian) to adjust your eating plan to your individual calorie needs. Reading food labels  Check food labels for the amount of sodium per serving. Choose foods with less than 5 percent of the Daily Value of sodium. Generally, foods with less than 300 mg of sodium per serving fit into this eating plan.  To find whole grains, look for the word "whole" as the first word in the ingredient list. Shopping  Buy products labeled as "low-sodium" or "no salt added."  Buy fresh foods. Avoid canned foods and premade or frozen meals. Cooking  Avoid adding salt when cooking. Use salt-free seasonings or herbs instead of table salt or sea salt. Check with your health care provider or pharmacist before using salt substitutes.  Do not fry foods. Cook foods using healthy methods such as baking, boiling, grilling, and broiling instead.  Cook with  heart-healthy oils, such as olive, canola, soybean, or sunflower oil. Meal planning   Eat a balanced diet that includes: ? 5 or more servings of fruits and vegetables each day. At each meal, try to fill half of your plate with fruits and vegetables. ? Up to 6-8 servings of whole grains each day. ? Less than 6 oz of lean meat, poultry, or fish each day. A 3-oz serving of meat is about the same size as a deck of cards. One egg equals 1 oz. ? 2 servings of low-fat dairy each day. ? A serving of nuts, seeds, or beans 5 times each week. ? Heart-healthy fats. Healthy fats called Omega-3 fatty acids are found in foods such as flaxseeds and coldwater fish, like sardines, salmon, and mackerel.  Limit how much you eat of the following: ? Canned or prepackaged foods. ? Food that is high in trans fat, such as fried foods. ? Food that is high in saturated fat, such as fatty meat. ? Sweets, desserts, sugary drinks, and other foods with added sugar. ? Full-fat dairy products.  Do not salt foods before eating.  Try to eat at least 2 vegetarian meals each week.  Eat more home-cooked food and less restaurant, buffet, and fast food.  When eating at a restaurant, ask that your food be prepared with less salt or no salt, if possible. What foods are recommended? The items listed may not be a complete list. Talk with your dietitian about what   dietary choices are best for you. Grains Whole-grain or whole-wheat bread. Whole-grain or whole-wheat pasta. Brown rice. Oatmeal. Quinoa. Bulgur. Whole-grain and low-sodium cereals. Pita bread. Low-fat, low-sodium crackers. Whole-wheat flour tortillas. Vegetables Fresh or frozen vegetables (raw, steamed, roasted, or grilled). Low-sodium or reduced-sodium tomato and vegetable juice. Low-sodium or reduced-sodium tomato sauce and tomato paste. Low-sodium or reduced-sodium canned vegetables. Fruits All fresh, dried, or frozen fruit. Canned fruit in natural juice (without  added sugar). Meat and other protein foods Skinless chicken or turkey. Ground chicken or turkey. Pork with fat trimmed off. Fish and seafood. Egg whites. Dried beans, peas, or lentils. Unsalted nuts, nut butters, and seeds. Unsalted canned beans. Lean cuts of beef with fat trimmed off. Low-sodium, lean deli meat. Dairy Low-fat (1%) or fat-free (skim) milk. Fat-free, low-fat, or reduced-fat cheeses. Nonfat, low-sodium ricotta or cottage cheese. Low-fat or nonfat yogurt. Low-fat, low-sodium cheese. Fats and oils Soft margarine without trans fats. Vegetable oil. Low-fat, reduced-fat, or light mayonnaise and salad dressings (reduced-sodium). Canola, safflower, olive, soybean, and sunflower oils. Avocado. Seasoning and other foods Herbs. Spices. Seasoning mixes without salt. Unsalted popcorn and pretzels. Fat-free sweets. What foods are not recommended? The items listed may not be a complete list. Talk with your dietitian about what dietary choices are best for you. Grains Baked goods made with fat, such as croissants, muffins, or some breads. Dry pasta or rice meal packs. Vegetables Creamed or fried vegetables. Vegetables in a cheese sauce. Regular canned vegetables (not low-sodium or reduced-sodium). Regular canned tomato sauce and paste (not low-sodium or reduced-sodium). Regular tomato and vegetable juice (not low-sodium or reduced-sodium). Pickles. Olives. Fruits Canned fruit in a light or heavy syrup. Fried fruit. Fruit in cream or butter sauce. Meat and other protein foods Fatty cuts of meat. Ribs. Fried meat. Bacon. Sausage. Bologna and other processed lunch meats. Salami. Fatback. Hotdogs. Bratwurst. Salted nuts and seeds. Canned beans with added salt. Canned or smoked fish. Whole eggs or egg yolks. Chicken or turkey with skin. Dairy Whole or 2% milk, cream, and half-and-half. Whole or full-fat cream cheese. Whole-fat or sweetened yogurt. Full-fat cheese. Nondairy creamers. Whipped toppings.  Processed cheese and cheese spreads. Fats and oils Butter. Stick margarine. Lard. Shortening. Ghee. Bacon fat. Tropical oils, such as coconut, palm kernel, or palm oil. Seasoning and other foods Salted popcorn and pretzels. Onion salt, garlic salt, seasoned salt, table salt, and sea salt. Worcestershire sauce. Tartar sauce. Barbecue sauce. Teriyaki sauce. Soy sauce, including reduced-sodium. Steak sauce. Canned and packaged gravies. Fish sauce. Oyster sauce. Cocktail sauce. Horseradish that you find on the shelf. Ketchup. Mustard. Meat flavorings and tenderizers. Bouillon cubes. Hot sauce and Tabasco sauce. Premade or packaged marinades. Premade or packaged taco seasonings. Relishes. Regular salad dressings. Where to find more information:  National Heart, Lung, and Blood Institute: www.nhlbi.nih.gov  American Heart Association: www.heart.org Summary  The DASH eating plan is a healthy eating plan that has been shown to reduce high blood pressure (hypertension). It may also reduce your risk for type 2 diabetes, heart disease, and stroke.  With the DASH eating plan, you should limit salt (sodium) intake to 2,300 mg a day. If you have hypertension, you may need to reduce your sodium intake to 1,500 mg a day.  When on the DASH eating plan, aim to eat more fresh fruits and vegetables, whole grains, lean proteins, low-fat dairy, and heart-healthy fats.  Work with your health care provider or diet and nutrition specialist (dietitian) to adjust your eating plan to your individual   calorie needs. This information is not intended to replace advice given to you by your health care provider. Make sure you discuss any questions you have with your health care provider. Document Released: 06/28/2011 Document Revised: 07/02/2016 Document Reviewed: 07/02/2016 Elsevier Interactive Patient Education  2018 Elsevier Inc.  

## 2017-08-13 NOTE — Addendum Note (Signed)
Addended by: Modena Nunnery on: 08/13/2017 09:07 AM   Modules accepted: Orders

## 2017-08-13 NOTE — Assessment & Plan Note (Signed)
BP Readings from Last 3 Encounters:  08/13/17 (!) 142/98  04/01/17 110/68  03/21/17 134/84   This is a new thing for her Repeat 140/102 on right She believes this may be related to slipping on lifestyle measures and is reluctant to start medication Urinalysis shows no proteinuria  I recommended trying medication but she wants a brief trial of lifestyle interventions DASH info given Will increase exercise Follow up 1 month or so

## 2017-09-13 ENCOUNTER — Ambulatory Visit: Payer: Managed Care, Other (non HMO) | Admitting: Family Medicine

## 2017-10-04 ENCOUNTER — Ambulatory Visit (INDEPENDENT_AMBULATORY_CARE_PROVIDER_SITE_OTHER)
Admission: RE | Admit: 2017-10-04 | Discharge: 2017-10-04 | Disposition: A | Discharge status: Home / Self Care | Payer: Managed Care, Other (non HMO) | Source: Ambulatory Visit | Attending: Family Medicine | Admitting: Family Medicine

## 2017-10-04 ENCOUNTER — Ambulatory Visit: Payer: Managed Care, Other (non HMO) | Admitting: Family Medicine

## 2017-10-04 ENCOUNTER — Encounter: Payer: Self-pay | Admitting: Family Medicine

## 2017-10-04 VITALS — BP 170/100 | HR 91 | Temp 99.4°F | Wt 179.5 lb

## 2017-10-04 DIAGNOSIS — R059 Cough, unspecified: Secondary | ICD-10-CM

## 2017-10-04 DIAGNOSIS — R05 Cough: Secondary | ICD-10-CM

## 2017-10-04 LAB — POC INFLUENZA A&B (BINAX/QUICKVUE)
Influenza A, POC: NEGATIVE
Influenza B, POC: NEGATIVE

## 2017-10-04 IMAGING — DX DG CHEST 2V
2 series · 2 of 2 positions shown · non-contrast
Comparison: None.

CLINICAL DATA: Cough

EXAM:
CHEST - 2 VIEW

[chest pa]
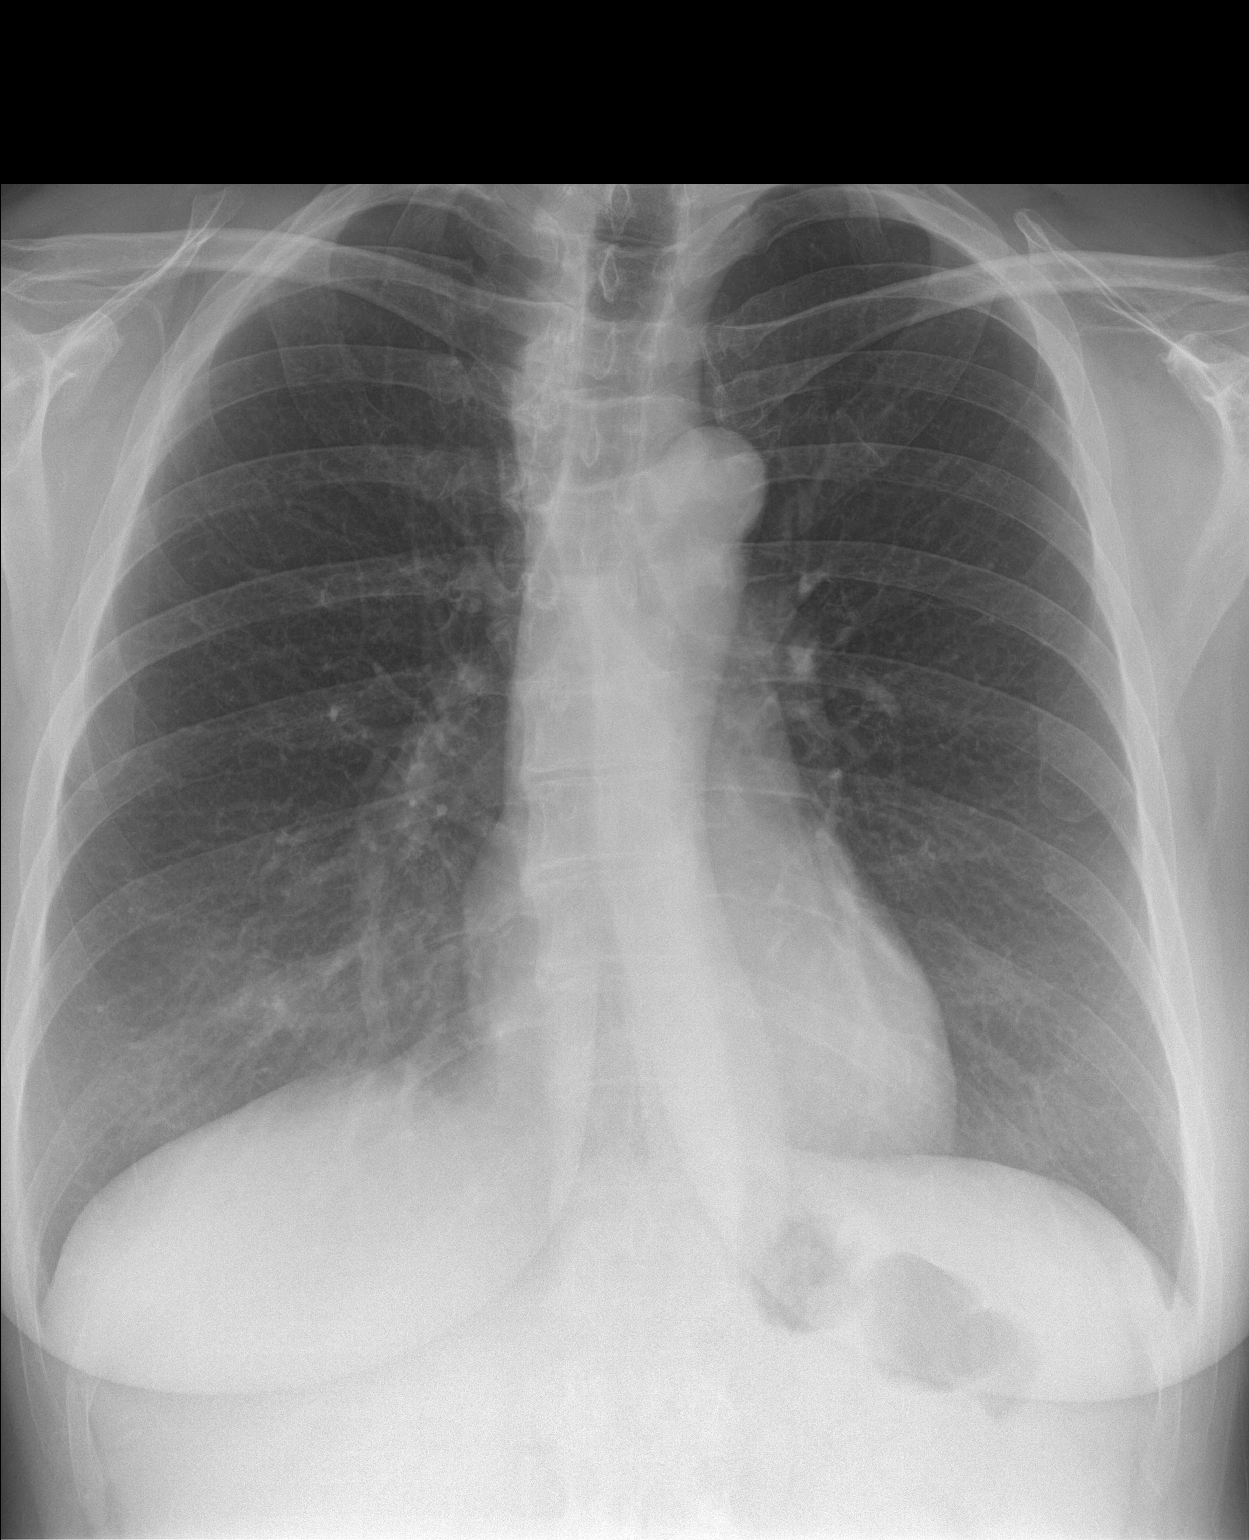

[chest lat]
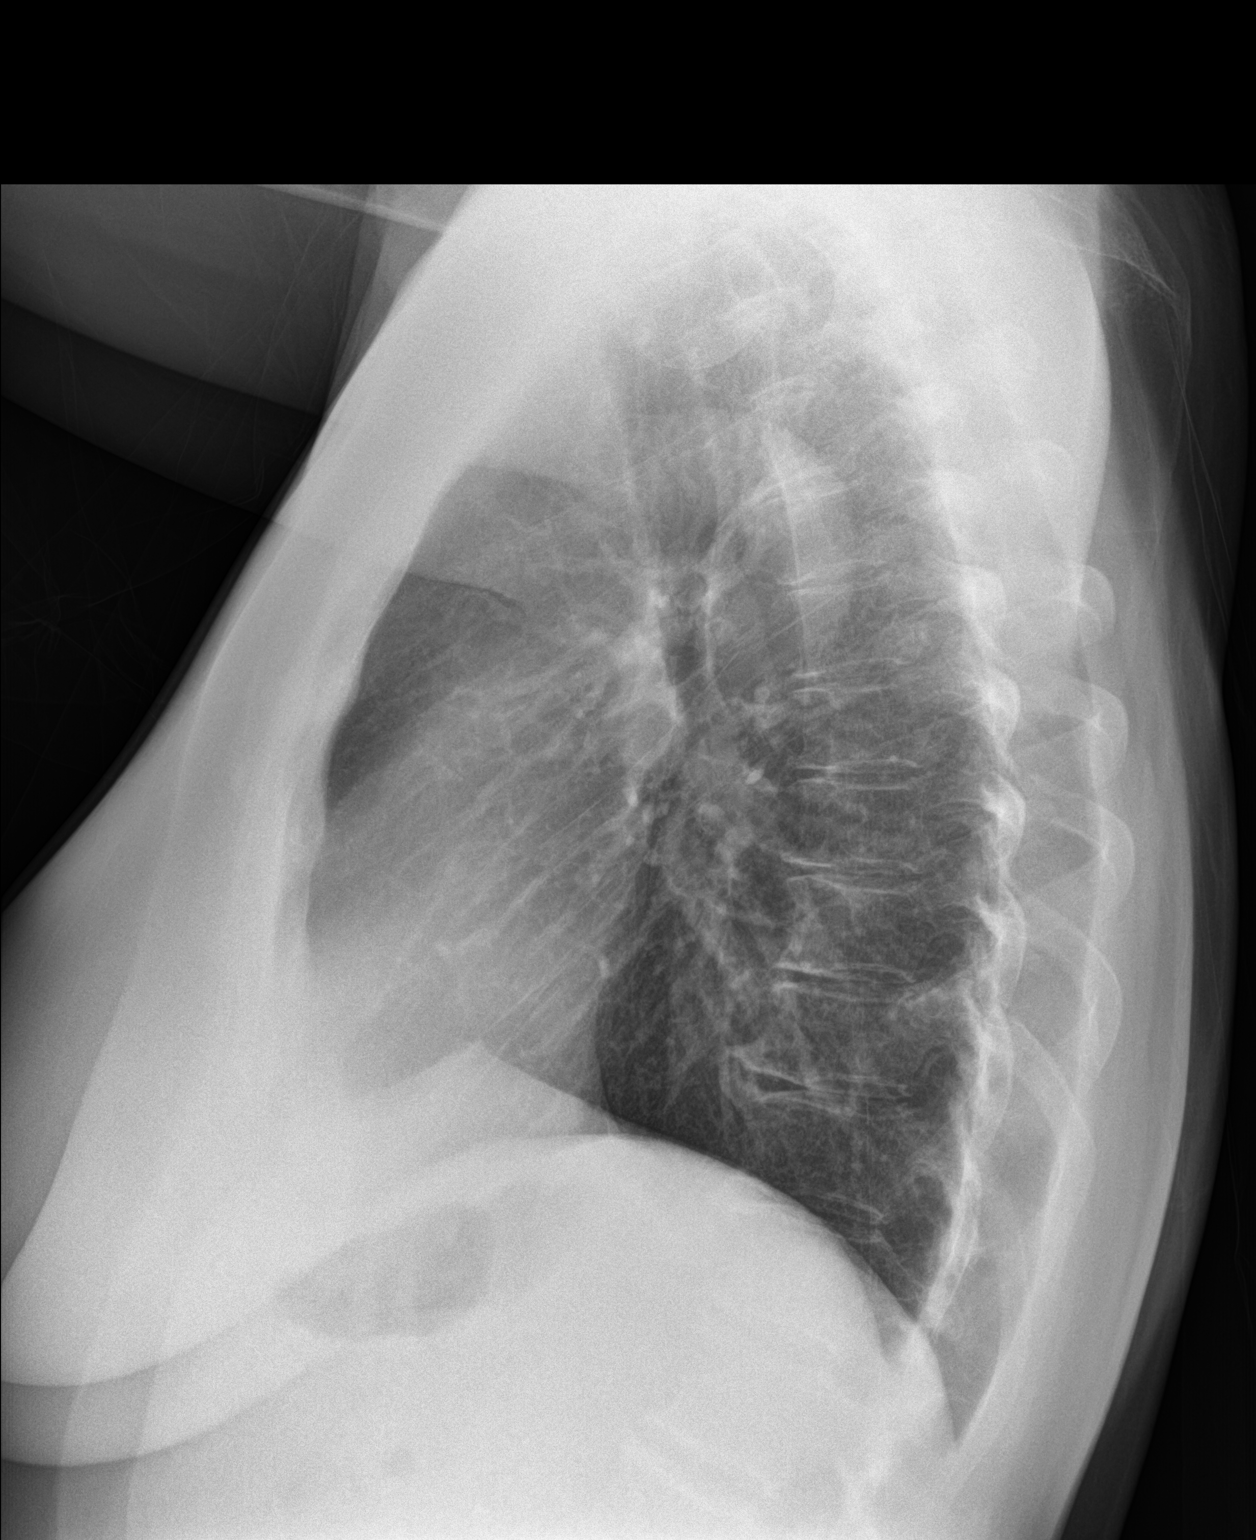

[2 of 2 positions shown; findings below may reference images not displayed]

FINDINGS: The heart size and mediastinal contours are within normal limits.
Both lungs are clear. The visualized skeletal structures are
unremarkable.
IMPRESSION: No active cardiopulmonary disease.

## 2017-10-04 MED ORDER — HYDROCODONE-HOMATROPINE 5-1.5 MG/5ML PO SYRP: 5.0000 mL | ORAL_SOLUTION | Freq: Three times a day (TID) | ORAL | 0 refills | Status: DC | PRN

## 2017-10-04 MED ORDER — BENZONATATE 200 MG PO CAPS: 200.0000 mg | ORAL_CAPSULE | Freq: Three times a day (TID) | ORAL | 1 refills | Status: DC | PRN

## 2017-10-04 NOTE — Patient Instructions (Signed)
Go to the lab on the way out.  We'll contact you with your xray report. Use tessalon for the cough (pills).  If still coughing, try hycodan (liquid). Update [place] as needed.  Take care.  Glad to see you.  Rest and fluids.

## 2017-10-04 NOTE — Progress Notes (Signed)
She had variable BP but usually lower than today.   Sx started yesterday.  Started with ST, then mild cough.  She was in [place] until yesterday, flew home, some sx predate the flight. Muscles aching.  No dysuria.  Pain with a deep breath. Cough continues, sounds "wet".  Unknown fever but had chills.  No vomiting, no diarrhea.  No rash.  Had a flu shot.    Meds, vitals, and allergies reviewed.   ROS: Per HPI unless specifically indicated in ROS section   GEN: nad, alert and oriented HEENT: mucous membranes moist, tm w/o erythema, nasal exam w/o erythema, clear discharge noted,  OP with cobblestoning NECK: supple w/o LA CV: rrr.   PULM: ctab, no inc wob EXT: no edema SKIN: no acute rash

## 2017-10-06 DIAGNOSIS — R051 Acute cough: Secondary | ICD-10-CM | POA: Insufficient documentation

## 2017-10-06 DIAGNOSIS — R059 Cough, unspecified: Secondary | ICD-10-CM | POA: Insufficient documentation

## 2017-10-06 DIAGNOSIS — R05 Cough: Secondary | ICD-10-CM | POA: Insufficient documentation

## 2017-10-06 DIAGNOSIS — R058 Other specified cough: Secondary | ICD-10-CM | POA: Insufficient documentation

## 2017-10-06 NOTE — Assessment & Plan Note (Signed)
CXR neg. I looked at it along with the rady report.  See notes on imaging. Flu negative.  No sign of pneumonia on chest x-ray.  Still moving air well. She looks like she does not feel well but is clearly nontoxic appearing.  It appears that she has a non-flu virus.  Still okay for outpatient follow-up. Use tessalon for the cough.  If still coughing, try hycodan.  Update [place] as needed.  She agrees.

## 2017-10-09 ENCOUNTER — Ambulatory Visit: Payer: Self-pay

## 2017-10-09 MED ORDER — HYDROCODONE-HOMATROPINE 5-1.5 MG/5ML PO SYRP: 5.0000 mL | ORAL_SOLUTION | Freq: Three times a day (TID) | ORAL | 0 refills | Status: DC | PRN

## 2017-10-09 NOTE — Telephone Encounter (Signed)
Sent refill on the cough syrup (I initially reprinted rx- please shred it).  Would still give this a little more time but if worse in the meantime then needs recheck.  Thanks.

## 2017-10-09 NOTE — Telephone Encounter (Signed)
Patient notified as instructed by telephone and verbalized understanding. 

## 2017-10-09 NOTE — Telephone Encounter (Signed)
Pt. States she feels "a little better, but still has a bad cough,achiness and chills." Can something else be called in. Would like a refill of cough syrup. States Tessalon did not help cough.Uses [facility] in [place]. Answer Assessment - Initial Assessment Questions 1. ONSET: "When did the cough begin?"      Started last week 2. SEVERITY: "How bad is the cough today?"      Severe 3. RESPIRATORY DISTRESS: "Describe your breathing."      No 4. FEVER: "Do you have a fever?" If so, ask: "What is your temperature, how was it measured, and when did it start?"     Unsure - has chills then gets hot. 5. HEMOPTYSIS: "Are you coughing up any blood?" If so ask: "How much?" (flecks, streaks, tablespoons, etc.)     No 6. TREATMENT: "What have you done so far to treat the cough?" (e.g., meds, fluids, humidifier)     Tessalon does not help 7. CARDIAC HISTORY: "Do you have any history of heart disease?" (e.g., heart attack, congestive heart failure)      No 8. LUNG HISTORY: "Do you have any history of lung disease?"  (e.g., pulmonary embolus, asthma, emphysema)     No 9. PE RISK FACTORS: "Do you have a history of blood clots?" (or: recent major surgery, recent prolonged travel, bedridden )     No 10. OTHER SYMPTOMS: "Do you have any other symptoms? (e.g., runny nose, wheezing, chest pain)       Has Crackles 11. PREGNANCY: "Is there any chance you are pregnant?" "When was your last menstrual period?"       No 12. TRAVEL: "Have you traveled out of the country in the last month?" (e.g., travel history, exposures)       No  Protocols used: COUGH - ACUTE NON-PRODUCTIVE-A-AH

## 2017-10-11 ENCOUNTER — Ambulatory Visit: Payer: Managed Care, Other (non HMO) | Admitting: Family Medicine

## 2017-10-18 ENCOUNTER — Ambulatory Visit: Payer: Managed Care, Other (non HMO) | Admitting: Family Medicine

## 2017-10-28 ENCOUNTER — Encounter: Payer: Self-pay | Admitting: Family Medicine

## 2017-10-28 ENCOUNTER — Ambulatory Visit: Payer: Managed Care, Other (non HMO) | Admitting: Family Medicine

## 2017-10-28 VITALS — BP 120/85 | HR 80 | Temp 98.1°F | Ht 67.0 in | Wt 183.0 lb

## 2017-10-28 DIAGNOSIS — G933 Postviral fatigue syndrome: Secondary | ICD-10-CM

## 2017-10-28 DIAGNOSIS — G9331 Postviral fatigue syndrome: Secondary | ICD-10-CM | POA: Insufficient documentation

## 2017-10-28 DIAGNOSIS — R030 Elevated blood-pressure reading, without diagnosis of hypertension: Secondary | ICD-10-CM

## 2017-10-28 NOTE — Assessment & Plan Note (Signed)
Still battling fatigue but other symptoms are gone (from mid march) Disc expectations for this Do sleep as much as needed  Gradually return to regular activity- disc starting walking 10 min and inc from there Then return to classes when ready Eliminate junk food and eat more fruit/veg Continue good water intake Keep [place] posted

## 2017-10-28 NOTE — Progress Notes (Signed)
Subjective:    Patient ID: Deborah Orozco, female    DOB: [birth year], 62 y.o.   MRN: [id number]  HPI Here for concerns about blood pressure   Was very elevated donating blood  Then had a virus   Wt Readings from Last 3 Encounters:  10/28/17 183 lb (83 kg)  10/04/17 179 lb 8 oz (81.4 kg)  08/13/17 183 lb 8 oz (83.2 kg)   28.66 kg/m   Still very tired after her illness Wants to sleep 9 hours per day-unusual for her  Drinking a lot of fluids / water /OJ   She is trying to get away from processed foods  Also eating some junk due to fatigue and lack of motivation  Not very active-sleeping 9 hours a night No more symptoms of illness however   No exercise yet since ill  She usually does zumba/ walking   Hx of elevated bp  BP Readings from Last 3 Encounters:  10/28/17 120/90  10/04/17 (!) 170/100  08/13/17 (!) 142/98  was sick 9/70 [facility]  Better on 2nd check  BP: 120/85     Mother has HTN  Father does not    Lab Results  Component Value Date   CREATININE 0.80 03/21/2017   BUN 14 03/21/2017   NA 142 03/21/2017   K 4.5 03/21/2017   CL 108 03/21/2017   CO2 29 03/21/2017   Lab Results  Component Value Date   WBC 6.2 03/21/2017   HGB 12.7 03/21/2017   HCT 39.8 03/21/2017   MCV 83.5 03/21/2017   PLT 251.0 03/21/2017    Lab Results  Component Value Date   ALT 22 03/21/2017   AST 17 03/21/2017   ALKPHOS 68 03/21/2017   BILITOT 0.7 03/21/2017    Lab Results  Component Value Date   CHOL 187 06/28/2015   HDL 63.00 06/28/2015   LDLCALC 107 (H) 06/28/2015   LDLDIRECT 133.1 03/14/2010   TRIG 88.0 06/28/2015   CHOLHDL 3 06/28/2015   Patient Active Problem List   Diagnosis Date Noted  . Post viral syndrome 10/28/2017  . Cough 10/06/2017  . Elevated blood-pressure reading without diagnosis of hypertension 08/13/2017  . Fatigue 04/01/2017  . 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 Osteopenia 07/05/2015  . Family history of  colon cancer 11/20/2013  . Actinic keratosis 12/12/2012  . Anemia 07/09/2012  . Colon cancer screening 07/09/2012  . Breast screening, unspecified 04/08/2012  . Routine general medical examination at a health care facility 12/20/2011  . Vitamin D deficiency 03/14/2010   Past Medical History:  Diagnosis Date  . Post-menopausal    Past Surgical History:  Procedure Laterality Date  . NO PAST SURGERIES     Social History   Tobacco Use  . Smoking status: Never Smoker  . Smokeless tobacco: Never Used  Substance Use Topics  . Alcohol use: Yes    Alcohol/week: 1.2 oz    Types: 2 Glasses of wine per week    Comment: occ  . Drug use: No   Family History  Problem Relation Age of Onset  . Cancer Mother 1       Breast twice recurred at 46/colon/liver  . Diabetes Mother   . Hypertension Mother   . Colon cancer Mother 30  . Hypertension Father   . Heart disease Brother   . Cancer Maternal Grandfather        possible colon cancer unsure of exact location gastro/tn   No Known  Allergies Current Outpatient Medications on File Prior to Visit  Medication Sig Dispense Refill  . Cholecalciferol (VITAMIN D-3) 5000 units TABS Take 1 capsule by mouth daily.    . fexofenadine (ALLEGRA) 180 MG tablet Take 180 mg by mouth daily.    . Multiple Vitamin (MULTIVITAMIN) capsule Take by mouth. HERBALIFE     No current facility-administered medications on file prior to visit.      Review of Systems  Constitutional: Positive for fatigue. Negative for activity change, appetite change, fever and unexpected weight change.  HENT: Negative for congestion, ear pain, rhinorrhea, sinus pressure and sore throat.   Eyes: Negative for pain, redness and visual disturbance.  Respiratory: Negative for cough, shortness of breath and wheezing.   Cardiovascular: Negative for chest pain and palpitations.  Gastrointestinal: Negative for abdominal pain, blood in stool, constipation and diarrhea.  Endocrine:  Negative for polydipsia and polyuria.  Genitourinary: Negative for dysuria, frequency and urgency.  Musculoskeletal: Negative for arthralgias, back pain and myalgias.  Skin: Negative for pallor and rash.  Allergic/Immunologic: Negative for environmental allergies.  Neurological: Negative for dizziness, syncope and headaches.  Hematological: Negative for adenopathy. Does not bruise/bleed easily.  Psychiatric/Behavioral: Negative for decreased concentration and dysphoric mood. The patient is not nervous/anxious.        Objective:   Physical Exam  Constitutional: She appears well-developed and well-nourished. No distress.  Well appearing   HENT:  Head: Normocephalic and atraumatic.  Mouth/Throat: Oropharynx is clear and moist.  Eyes: Pupils are equal, round, and reactive to light. Conjunctivae and EOM are normal.  Neck: Normal range of motion. Neck supple. No JVD present. Carotid bruit is not present. No thyromegaly present.  Cardiovascular: Normal rate, regular rhythm, normal heart sounds and intact distal pulses. Exam reveals no gallop.  Pulmonary/Chest: Effort normal and breath sounds normal. No respiratory distress. She has no wheezes. She has no rales.  No crackles  Good air exch  No rales or rhonchi   Abdominal: Soft. Bowel sounds are normal. She exhibits no distension, no abdominal bruit and no mass. There is no tenderness.  Musculoskeletal: She exhibits no edema.  Lymphadenopathy:    She has no cervical adenopathy.  Neurological: She is alert. She has normal reflexes.  Skin: Skin is warm and dry. No rash noted. No pallor.  No rash   Psychiatric: She has a normal mood and affect.  Mood is good           Assessment & Plan:   Problem List Items Addressed This Visit      Other   Elevated blood-pressure reading without diagnosis of hypertension - Primary    Improved today  BP: 120/85  Will continue to work on diet/exercise as she can (with post viral fatigue)    Handout given  Suspect she will eventually need to be tx for essential HTN (fam hx)  F/u June as planned       Post viral syndrome    Still battling fatigue but other symptoms are gone (from mid march) Disc expectations for this Do sleep as much as needed  Gradually return to regular activity- disc starting walking 10 min and inc from there Then return to classes when ready Eliminate junk food and eat more fruit/veg Continue good water intake Keep [place] posted

## 2017-10-28 NOTE — Assessment & Plan Note (Signed)
Improved today  BP: 120/85  Will continue to work on diet/exercise as she can (with post viral fatigue)  Handout given  Suspect she will eventually need to be tx for essential HTN (fam hx)  F/u June as planned

## 2017-10-28 NOTE — Patient Instructions (Addendum)
Gradually increase your activity level (after illness)   Work on getting rid of the junk food  Drink fluids  Avoid processed foods Listen to your body and get sleep  Get outdoors as much as you can    Follow up in June as planned

## 2017-11-01 ENCOUNTER — Ambulatory Visit: Payer: Managed Care, Other (non HMO) | Admitting: Family Medicine

## 2017-12-19 ENCOUNTER — Telehealth: Payer: Self-pay | Admitting: Family Medicine

## 2017-12-19 DIAGNOSIS — E559 Vitamin D deficiency, unspecified: Secondary | ICD-10-CM

## 2017-12-19 DIAGNOSIS — Z0000 Encounter for general adult medical examination without abnormal findings: Secondary | ICD-10-CM

## 2017-12-19 NOTE — Telephone Encounter (Signed)
-----   Message from Ellamae Sia sent at 12/09/2017 11:06 AM EDT ----- Regarding: Lab orders for Friday, 5.31.19 Patient is scheduled for CPX labs, please order future labs, Thanks , Karna Christmas

## 2017-12-20 ENCOUNTER — Other Ambulatory Visit: Payer: Managed Care, Other (non HMO)

## 2017-12-24 ENCOUNTER — Encounter: Payer: Managed Care, Other (non HMO) | Admitting: Family Medicine

## 2018-02-17 ENCOUNTER — Encounter: Payer: Managed Care, Other (non HMO) | Admitting: Family Medicine

## 2018-02-25 ENCOUNTER — Other Ambulatory Visit (INDEPENDENT_AMBULATORY_CARE_PROVIDER_SITE_OTHER): Payer: Managed Care, Other (non HMO)

## 2018-02-25 DIAGNOSIS — E559 Vitamin D deficiency, unspecified: Secondary | ICD-10-CM | POA: Diagnosis not present

## 2018-02-25 DIAGNOSIS — Z0000 Encounter for general adult medical examination without abnormal findings: Secondary | ICD-10-CM | POA: Diagnosis not present

## 2018-02-25 LAB — CBC WITH DIFFERENTIAL/PLATELET
Basophils Absolute: 0.1 10*3/uL (ref 0.0–0.1)
Basophils Relative: 1.2 % (ref 0.0–3.0)
EOS ABS: 0.4 10*3/uL (ref 0.0–0.7)
Eosinophils Relative: 7 % — ABNORMAL HIGH (ref 0.0–5.0)
HEMATOCRIT: 38.3 % (ref 36.0–46.0)
HEMOGLOBIN: 12.9 g/dL (ref 12.0–15.0)
LYMPHS PCT: 30 % (ref 12.0–46.0)
Lymphs Abs: 1.7 10*3/uL (ref 0.7–4.0)
MCHC: 33.6 g/dL (ref 30.0–36.0)
MCV: 84.4 fl (ref 78.0–100.0)
Monocytes Absolute: 0.5 10*3/uL (ref 0.1–1.0)
Monocytes Relative: 8.6 % (ref 3.0–12.0)
Neutro Abs: 3 10*3/uL (ref 1.4–7.7)
Neutrophils Relative %: 53.2 % (ref 43.0–77.0)
Platelets: 231 10*3/uL (ref 150.0–400.0)
RBC: 4.54 Mil/uL (ref 3.87–5.11)
RDW: 13.1 % (ref 11.5–15.5)
WBC: 5.7 10*3/uL (ref 4.0–10.5)

## 2018-02-25 LAB — TSH: TSH: 3.41 u[IU]/mL (ref 0.35–4.50)

## 2018-02-25 LAB — COMPREHENSIVE METABOLIC PANEL
ALK PHOS: 63 U/L (ref 39–117)
ALT: 18 U/L (ref 0–35)
AST: 14 U/L (ref 0–37)
Albumin: 4.1 g/dL (ref 3.5–5.2)
BILIRUBIN TOTAL: 0.6 mg/dL (ref 0.2–1.2)
BUN: 13 mg/dL (ref 6–23)
CALCIUM: 9.3 mg/dL (ref 8.4–10.5)
CO2: 29 mEq/L (ref 19–32)
Chloride: 109 mEq/L (ref 96–112)
Creatinine, Ser: 0.81 mg/dL (ref 0.40–1.20)
GFR: 76.62 mL/min (ref >60.00)
Glucose, Bld: 102 mg/dL — ABNORMAL HIGH (ref 70–99)
POTASSIUM: 4.1 meq/L (ref 3.5–5.1)
Sodium: 144 mEq/L (ref 135–145)
Total Protein: 6 g/dL (ref 6.0–8.3)

## 2018-02-25 LAB — LIPID PANEL
CHOL/HDL RATIO: 2
CHOLESTEROL: 181 mg/dL (ref 0–200)
HDL: 74.6 mg/dL (ref >39.00)
LDL CALC: 90 mg/dL (ref 0–99)
NonHDL: 106.35
Triglycerides: 81 mg/dL (ref 0.0–149.0)
VLDL: 16.2 mg/dL (ref 0.0–40.0)

## 2018-02-25 LAB — VITAMIN D 25 HYDROXY (VIT D DEFICIENCY, FRACTURES): VITD: 59.81 ng/mL (ref 30.00–100.00)

## 2018-02-28 ENCOUNTER — Ambulatory Visit (INDEPENDENT_AMBULATORY_CARE_PROVIDER_SITE_OTHER): Payer: Managed Care, Other (non HMO) | Admitting: Family Medicine

## 2018-02-28 ENCOUNTER — Encounter: Payer: Self-pay | Admitting: Family Medicine

## 2018-02-28 VITALS — BP 144/96 | HR 81 | Temp 97.6°F | Ht 67.25 in | Wt 185.5 lb

## 2018-02-28 DIAGNOSIS — E559 Vitamin D deficiency, unspecified: Secondary | ICD-10-CM

## 2018-02-28 DIAGNOSIS — Z800 Family history of malignant neoplasm of digestive organs: Secondary | ICD-10-CM | POA: Diagnosis not present

## 2018-02-28 DIAGNOSIS — Z0000 Encounter for general adult medical examination without abnormal findings: Secondary | ICD-10-CM | POA: Diagnosis not present

## 2018-02-28 DIAGNOSIS — Z23 Encounter for immunization: Secondary | ICD-10-CM | POA: Diagnosis not present

## 2018-02-28 DIAGNOSIS — M8580 Other specified disorders of bone density and structure, unspecified site: Secondary | ICD-10-CM

## 2018-02-28 DIAGNOSIS — R7309 Other abnormal glucose: Secondary | ICD-10-CM

## 2018-02-28 DIAGNOSIS — R030 Elevated blood-pressure reading, without diagnosis of hypertension: Secondary | ICD-10-CM | POA: Diagnosis not present

## 2018-02-28 DIAGNOSIS — R5382 Chronic fatigue, unspecified: Secondary | ICD-10-CM

## 2018-02-28 NOTE — Progress Notes (Signed)
Subjective:    Patient ID: Deborah Orozco, female    DOB: 1955/08/31, 62 y.o.   MRN: [id number]  HPI  Here for health maintenance exam and to review chronic medical problems    Has been struggling with fatigue  Sleeps 8-10 hours wakes up still tired  Unsure about snoring status   Not depressed but feels overwhelmed  Not feeling like herself  ? If from fatigue or vice versa  Has family hx of depression  Feels down at times  Possible anhedonia  Positive depression screen today of 11 (fatigue/appetite change/neg thoughts)    Also no longer exercising and weight is creeping up    Wt Readings from Last 3 Encounters:  02/28/18 185 lb 8 oz (84.1 kg)  10/28/17 183 lb (83 kg)  10/04/17 179 lb 8 oz (81.4 kg)  has gained weight  Made herself go to the gym - it is a struggle  28.84 kg/m   [facility]  Self breast exam - no lumps or changes   Pap - did in the past year  [facility]es to [facility]   Flu shot 9/18   Tetanus shot 8/11 Needs a Tdap= new baby coming in the family   Colonoscopy 7/15  Family hx of colon cancer -mother at age 41  dexa 2016 -- thinks she had another one this year  Osteopenia - ? If worse or the same  H/o D def Level 59.8  5000 iu D and ca  Exercises on and off    BP BP Readings from Last 3 Encounters:  02/28/18 (!) 144/96  10/28/17 120/85  10/04/17 (!) 170/100  has been elevated in the past  Has fam hx of HTN  She avoids processed foods/sodium  Some headaches   Pulse Readings from Last 3 Encounters:  02/28/18 81  10/28/17 80  10/04/17 91   Cholesterol Lab Results  Component Value Date   CHOL 181 02/25/2018   CHOL 187 06/28/2015   CHOL 212 (H) 11/13/2013   Lab Results  Component Value Date   HDL 74.60 02/25/2018   HDL 63.00 06/28/2015   HDL 76.00 11/13/2013   Lab Results  Component Value Date   LDLCALC 90 02/25/2018   LDLCALC 107 (H) 06/28/2015   LDLCALC 120 (H) 11/13/2013   Lab Results    Component Value Date   TRIG 81.0 02/25/2018   TRIG 88.0 06/28/2015   TRIG 82.0 11/13/2013   Lab Results  Component Value Date   CHOLHDL 2 02/25/2018   CHOLHDL 3 06/28/2015   CHOLHDL 3 11/13/2013   Lab Results  Component Value Date   LDLDIRECT 133.1 03/14/2010   Good cholesterol profile- eats pretty well   Other labs Results for orders placed or performed in visit on 02/25/18  VITAMIN D 25 Hydroxy (Vit-D Deficiency, Fractures)  Result Value Ref Range   VITD 59.81 30.00 - 100.00 ng/mL  TSH  Result Value Ref Range   TSH 3.41 0.35 - 4.50 uIU/mL  Lipid panel  Result Value Ref Range   Cholesterol 181 0 - 200 mg/dL   Triglycerides 81.0 0.0 - 149.0 mg/dL   HDL 74.60 >39.00 mg/dL   VLDL 16.2 0.0 - 40.0 mg/dL   LDL Cholesterol 90 0 - 99 mg/dL   Total CHOL/HDL Ratio 2    NonHDL 106.35   CBC with Differential/Platelet  Result Value Ref Range   WBC 5.7 4.0 - 10.5 K/uL   RBC 4.54 3.87 -  5.11 Mil/uL   Hemoglobin 12.9 12.0 - 15.0 g/dL   HCT 38.3 36.0 - 46.0 %   MCV 84.4 78.0 - 100.0 fl   MCHC 33.6 30.0 - 36.0 g/dL   RDW 13.1 11.5 - 15.5 %   Platelets 231.0 150.0 - 400.0 K/uL   Neutrophils Relative % 53.2 43.0 - 77.0 %   Lymphocytes Relative 30.0 12.0 - 46.0 %   Monocytes Relative 8.6 3.0 - 12.0 %   Eosinophils Relative 7.0 (H) 0.0 - 5.0 %   Basophils Relative 1.2 0.0 - 3.0 %   Neutro Abs 3.0 1.4 - 7.7 K/uL   Lymphs Abs 1.7 0.7 - 4.0 K/uL   Monocytes Absolute 0.5 0.1 - 1.0 K/uL   Eosinophils Absolute 0.4 0.0 - 0.7 K/uL   Basophils Absolute 0.1 0.0 - 0.1 K/uL  Comprehensive metabolic panel  Result Value Ref Range   Sodium 144 135 - 145 mEq/L   Potassium 4.1 3.5 - 5.1 mEq/L   Chloride 109 96 - 112 mEq/L   CO2 29 19 - 32 mEq/L   Glucose, Bld 102 (H) 70 - 99 mg/dL   BUN 13 6 - 23 mg/dL   Creatinine, Ser 0.81 0.40 - 1.20 mg/dL   Total Bilirubin 0.6 0.2 - 1.2 mg/dL   Alkaline Phosphatase 63 39 - 117 U/L   AST 14 0 - 37 U/L   ALT 18 0 - 35 U/L   Total Protein 6.0 6.0 -  8.3 g/dL   Albumin 4.1 3.5 - 5.2 g/dL   Calcium 9.3 8.4 - 10.5 mg/dL   GFR 76.62 >60.00 mL/min    Mother was diabetic Glucose 102    Review of Systems  Constitutional: Positive for fatigue. Negative for activity change, appetite change, fever and unexpected weight change.  HENT: Negative for congestion, ear pain, rhinorrhea, sinus pressure and sore throat.   Eyes: Negative for pain, redness and visual disturbance.  Respiratory: Negative for cough, shortness of breath and wheezing.   Cardiovascular: Negative for chest pain and palpitations.  Gastrointestinal: Negative for abdominal pain, blood in stool, constipation and diarrhea.  Endocrine: Negative for polydipsia and polyuria.  Genitourinary: Negative for dysuria, frequency and urgency.  Musculoskeletal: Negative for arthralgias, back pain and myalgias.  Skin: Negative for pallor and rash.  Allergic/Immunologic: Negative for environmental allergies.  Neurological: Positive for headaches. Negative for dizziness, seizures, syncope, facial asymmetry, light-headedness and numbness.  Hematological: Negative for adenopathy. Does not bruise/bleed easily.  Psychiatric/Behavioral: Negative for decreased concentration and dysphoric mood. The patient is not nervous/anxious.        Objective:   Physical Exam  Constitutional: She appears well-developed and well-nourished. No distress.  overwt and well app  HENT:  Head: Normocephalic and atraumatic.  Right Ear: External ear normal.  Left Ear: External ear normal.  Mouth/Throat: Oropharynx is clear and moist.  Eyes: Pupils are equal, round, and reactive to light. Conjunctivae and EOM are normal. Right eye exhibits no discharge. Left eye exhibits no discharge. No scleral icterus.  Neck: Normal range of motion. Neck supple. No JVD present. Carotid bruit is not present. No thyromegaly present.  Cardiovascular: Normal rate, regular rhythm, normal heart sounds and intact distal pulses. Exam  reveals no gallop.  Pulmonary/Chest: Effort normal and breath sounds normal. No respiratory distress. She has no wheezes. She has no rales. She exhibits no tenderness. No breast tenderness, discharge or bleeding.  Abdominal: Soft. Bowel sounds are normal. She exhibits no distension, no abdominal bruit and no mass. There is  no tenderness.  Genitourinary: No breast tenderness, discharge or bleeding.  Genitourinary Comments: Gyn does breast and pelvic exam  Musculoskeletal: Normal range of motion. She exhibits no edema or tenderness.  No kyphosis   Lymphadenopathy:    She has no cervical adenopathy.  Neurological: She is alert. She has normal reflexes. She displays normal reflexes. No cranial nerve deficit. She exhibits normal muscle tone. Coordination normal.  Skin: Skin is warm and dry. No rash noted. No erythema. No pallor.  Solar lentigines diffusely Tanned  Some sks  Psychiatric: Her speech is normal and behavior is normal. Thought content normal. Her affect is not blunt, not labile and not inappropriate. She is not actively hallucinating. She exhibits a depressed mood.  Seems fatigued and possibly a bit down  Affect is less energetic but not blunted Disc stressors openly She is attentive.          Assessment & Plan:   Problem List Items Addressed This Visit      Musculoskeletal and Integument   Osteopenia    Sent for dexa from gyn done w/in last year Disc need for calcium/ vitamin D/ wt bearing exercise and bone density test every 2 y to monitor Disc safety/ fracture risk in detail          Other   Elevated blood-pressure reading without diagnosis of hypertension    BP: (!) 144/96    This is up - wt up also and less exercise Disc lifestyle change  Has fam hx of HTN  Adv DASH eating and also more exercise if able  F/u about a month May start antihypertensive tx if needed Handouts given       Elevated random blood glucose level    102 fasting  fam hx of DM Will  watch this disc imp of low glycemic diet and wt loss to prevent DM2       Family history of colon cancer    Colonoscopy done 2015      Fatigue    Worse lately - less active from this and gaining wt Enc to get back to exercise Disc poss of sleep apnea - she does not think this is the cause, but handout given to read about  husb will tell her if she snores  Disc poss depression or dysthymia - I think she would benefit from psychology eval/counseling in the future  She is open to this      Routine general medical examination at a health care facility - Primary    Reviewed health habits including diet and exercise and skin cancer prevention Reviewed appropriate screening tests for age  Also reviewed health mt list, fam hx and immunization status , as well as social and family history   See HPI Labs reviewed  Disc exercise for fatigue and better health  Tdap updated today  Enc her to get a flu shot in the fall  Elevated bp- f/u in 1 mo to eval for poss HTN  Sent for pap/mammo/dexa reports from gyn       Relevant Orders   Tdap vaccine greater than or equal to 7yo IM (Completed)   Vitamin D deficiency    Vitamin D level is therapeutic with current supplementation Disc importance of this to bone and overall health 59.8- good intake        Other Visit Diagnoses    Need for Tdap vaccination       Relevant Orders   Tdap vaccine greater than or equal to 7yo  IM (Completed)

## 2018-02-28 NOTE — Patient Instructions (Addendum)
Read about sleep apnea-=do you think you have it ? Also - let's consider psychology referral   Don't forget to get a flu shot in Sept   Tdap vaccine today   Try to get into an exercise routine   Blood pressure is elevated  Follow up in a month to re check this and see if you need medication

## 2018-02-28 NOTE — Assessment & Plan Note (Signed)
Reviewed health habits including diet and exercise and skin cancer prevention Reviewed appropriate screening tests for age  Also reviewed health mt list, fam hx and immunization status , as well as social and family history   See HPI Labs reviewed  Disc exercise for fatigue and better health  Tdap updated today  Enc her to get a flu shot in the fall  Elevated bp- f/u in 1 mo to eval for poss HTN  Sent for pap/mammo/dexa reports from gyn

## 2018-02-28 NOTE — Assessment & Plan Note (Signed)
Sent for dexa from gyn done w/in last year Disc need for calcium/ vitamin D/ wt bearing exercise and bone density test every 2 y to monitor Disc safety/ fracture risk in detail

## 2018-02-28 NOTE — Assessment & Plan Note (Signed)
102 fasting  fam hx of DM Will watch this disc imp of low glycemic diet and wt loss to prevent DM2

## 2018-02-28 NOTE — Assessment & Plan Note (Signed)
BP: (!) 144/96    This is up - wt up also and less exercise Disc lifestyle change  Has fam hx of HTN  Adv DASH eating and also more exercise if able  F/u about a month May start antihypertensive tx if needed Handouts given

## 2018-02-28 NOTE — Assessment & Plan Note (Signed)
Colonoscopy done 2015

## 2018-02-28 NOTE — Assessment & Plan Note (Signed)
Vitamin D level is therapeutic with current supplementation Disc importance of this to bone and overall health 59.8- good intake

## 2018-02-28 NOTE — Assessment & Plan Note (Signed)
Worse lately - less active from this and gaining wt Enc to get back to exercise Disc poss of sleep apnea - she does not think this is the cause, but handout given to read about  husb will tell her if she snores  Disc poss depression or dysthymia - I think she would benefit from psychology eval/counseling in the future  She is open to this

## 2018-03-03 ENCOUNTER — Telehealth: Payer: Self-pay | Admitting: Family Medicine

## 2018-03-03 DIAGNOSIS — F341 Dysthymic disorder: Secondary | ICD-10-CM

## 2018-03-03 NOTE — Telephone Encounter (Signed)
Pt desires ref to counselor Will send to [facility] Please call her about setting this up

## 2018-03-05 NOTE — Telephone Encounter (Signed)
Called patient and scheduled with Deborah Orozco.

## 2018-03-28 ENCOUNTER — Ambulatory Visit: Payer: 59 | Admitting: Psychology

## 2018-03-28 DIAGNOSIS — F411 Generalized anxiety disorder: Secondary | ICD-10-CM

## 2018-04-24 ENCOUNTER — Ambulatory Visit (INDEPENDENT_AMBULATORY_CARE_PROVIDER_SITE_OTHER): Payer: Managed Care, Other (non HMO)

## 2018-04-24 DIAGNOSIS — Z23 Encounter for immunization: Secondary | ICD-10-CM

## 2018-05-02 ENCOUNTER — Telehealth: Payer: Self-pay

## 2018-05-02 DIAGNOSIS — R5382 Chronic fatigue, unspecified: Secondary | ICD-10-CM

## 2018-05-02 NOTE — Telephone Encounter (Signed)
Copied from [place] [id number]. Topic: Referral - Request for Referral >> May 02, 2018 12:49 PM Judyann Munson wrote: Has patient seen PCP for this complaint? Yes.   Referral for which specialty: Sleep Specialist  Preferred provider/office: location in [place] [address] Reason for referral: Sleep study

## 2018-05-02 NOTE — Telephone Encounter (Signed)
I did the ref  Will route to [facility]

## 2018-05-05 NOTE — Telephone Encounter (Signed)
Pulmonary Sleep Consult made, same day Appt 's made for patient and her husband for the same day, Appt's given to the patients husband.

## 2018-05-20 ENCOUNTER — Encounter: Payer: Self-pay | Admitting: Internal Medicine

## 2018-05-20 ENCOUNTER — Ambulatory Visit (INDEPENDENT_AMBULATORY_CARE_PROVIDER_SITE_OTHER): Payer: Managed Care, Other (non HMO) | Admitting: Internal Medicine

## 2018-05-20 ENCOUNTER — Institutional Professional Consult (permissible substitution): Payer: Self-pay | Admitting: Internal Medicine

## 2018-05-20 VITALS — BP 146/90 | HR 94 | Resp 16 | Ht 67.4 in | Wt 189.0 lb

## 2018-05-20 DIAGNOSIS — G4719 Other hypersomnia: Secondary | ICD-10-CM | POA: Diagnosis not present

## 2018-05-20 NOTE — Patient Instructions (Signed)

## 2018-05-20 NOTE — Progress Notes (Signed)
[facility] Consultation      Assessment and Plan:  Excessive daytime sleepiness. - Symptoms and signs of obstructive sleep apnea. - We will send for sleep study.   Orders Placed This Encounter  Procedures  . Home sleep test     Date: 05/20/2018  MRN# [id number] Ronald Vinsant 10-05-1955    Deborah Orozco is a 62 y.o. old female seen in consultation for chief complaint of:    Chief Complaint  Patient presents with  . Consult    Referred by Dr.Tower for excessive daytime sleepiness:    HPI:  She is tired during the day, she wakes up fatigued. When she wakes in the AM she does not feel well rested. She does not nod off during the day, but feels exhausted, more physically than anything else.  She has never been tested for OSA, her sister has OSA and wears CPAP.  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   She goes to bed at 11 pm, falls asleep quickly, up at 8 am. She sleep later on off days and feels better on those days.  She takes allegra daily, she has tried off of it and made no difference.   PMHX:   Past Medical History:  Diagnosis Date  . Post-menopausal    Surgical Hx:  Past Surgical History:  Procedure Laterality Date  . NO PAST SURGERIES     Family Hx:  Family History  Problem Relation Age of Onset  . Cancer Mother 35       Breast twice recurred at 46/colon/liver  . Diabetes Mother   . Hypertension Mother   . Colon cancer Mother 36  . Hypertension Father   . Heart disease Brother   . Cancer Maternal Grandfather        possible colon cancer unsure of exact location gastro/tn   Social Hx:   Social History   Tobacco Use  . Smoking status: Never Smoker  . Smokeless tobacco: Never Used  Substance Use Topics  . Alcohol use: Yes    Alcohol/week: 2.0 standard drinks    Types: 2 Glasses of wine per week    Comment: occ  . Drug  use: No   Medication:    Current Outpatient Medications:  .  Cholecalciferol (VITAMIN D-3) 5000 units TABS, Take 1 capsule by mouth daily., Disp: , Rfl:  .  fexofenadine (ALLEGRA) 180 MG tablet, Take 180 mg by mouth daily., Disp: , Rfl:  .  Multiple Vitamin (MULTIVITAMIN) capsule, Take by mouth. HERBALIFE, Disp: , Rfl:  .  Probiotic Product (PROBIOTIC PO), Take 1 capsule by mouth every morning., Disp: , Rfl:    Allergies:  Patient has no known allergies.  Review of Systems: Gen:  Denies  fever, sweats, chills HEENT: Denies blurred vision, double vision. bleeds, sore throat Cvc:  No dizziness, chest pain. Resp:   Denies cough or sputum production, shortness of breath Gi: Denies swallowing difficulty, stomach pain. Gu:  Denies bladder incontinence, burning urine Ext:   No Joint pain, stiffness. Skin: No skin rash,  hives  Endoc:  No polyuria, polydipsia. Psych: No depression, insomnia. Other:  All other systems were reviewed with the patient and were negative other that what is mentioned in the HPI.   Physical Examination:   VS: BP (!) 146/90 (BP Location: Left Arm, Cuff Size: Normal)   Pulse 94  Resp 16   Ht 5' 7.4" (1.712 m)   Wt 189 lb (85.7 kg)   SpO2 94%   BMI 29.25 kg/m   General Appearance: No distress  Neuro:without focal findings,  speech normal,  HEENT: PERRLA, EOM intact.   Pulmonary: normal breath sounds, No wheezing.  CardiovascularNormal S1,S2.  No m/r/g.   Abdomen: Benign, Soft, non-tender. Renal:  No costovertebral tenderness  GU:  No performed at this time. Endoc: No evident thyromegaly, no signs of acromegaly. Skin:   warm, no rashes, no ecchymosis  Extremities: normal, no cyanosis, clubbing.  Other findings:    LABORATORY PANEL:   CBC No results for input(s): WBC, HGB, HCT, PLT in the last 168 hours. ------------------------------------------------------------------------------------------------------------------  Chemistries  No results  for input(s): NA, K, CL, CO2, GLUCOSE, BUN, CREATININE, CALCIUM, MG, AST, ALT, ALKPHOS, BILITOT in the last 168 hours.  Invalid input(s): GFRCGP ------------------------------------------------------------------------------------------------------------------  Cardiac Enzymes No results for input(s): TROPONINI in the last 168 hours. ------------------------------------------------------------  RADIOLOGY:  No results found.     Thank  you for the consultation and for allowing [facility] to assist in the care of your patient. Our recommendations are noted above.  Please contact us if we can be of further service.   Marda Stalker, M.D., F.C.C.P.  Board Certified in Internal Medicine, Pulmonary Medicine, [place], and Sleep Medicine.  [facility] Office Number: [phone]   05/20/2018

## 2018-05-30 ENCOUNTER — Ambulatory Visit: Payer: Managed Care, Other (non HMO) | Admitting: Family Medicine

## 2018-06-09 ENCOUNTER — Ambulatory Visit: Payer: Managed Care, Other (non HMO) | Admitting: Family Medicine

## 2018-06-18 ENCOUNTER — Ambulatory Visit: Payer: Managed Care, Other (non HMO) | Admitting: Family Medicine

## 2018-06-18 ENCOUNTER — Encounter: Payer: Self-pay | Admitting: Family Medicine

## 2018-06-18 VITALS — BP 178/90 | HR 76 | Temp 98.4°F | Ht 67.4 in | Wt 190.1 lb

## 2018-06-18 DIAGNOSIS — J0110 Acute frontal sinusitis, unspecified: Secondary | ICD-10-CM

## 2018-06-18 DIAGNOSIS — R030 Elevated blood-pressure reading, without diagnosis of hypertension: Secondary | ICD-10-CM

## 2018-06-18 MED ORDER — AZITHROMYCIN 250 MG PO TABS: ORAL_TABLET | ORAL | 0 refills | Status: DC

## 2018-06-18 NOTE — Progress Notes (Signed)
Subjective:    Patient ID: Deborah Orozco, female    DOB: 1956-03-05, 62 y.o.   MRN: [id number]  HPI This is a 62 yo female who presents today with 2 weeks of cough. Started with sore throat which resolved but is now sore again. Headache. Took Advil cold and sinus with some relief. Cough is dry, worse with lying down. No SOB, no wheeze. No fever. No asthma, no smoking history.  No known sick contacts. VEry fatigued. Was feeling better last week, now worse.  Was recently on amoxicillin 500 mg for 10 days for tooth pain  Past Medical History:  Diagnosis Date  . Post-menopausal    Past Surgical History:  Procedure Laterality Date  . NO PAST SURGERIES     Family History  Problem Relation Age of Onset  . Cancer Mother 31       Breast twice recurred at 46/colon/liver  . Diabetes Mother   . Hypertension Mother   . Colon cancer Mother 42  . Hypertension Father   . Heart disease Brother   . Cancer Maternal Grandfather        possible colon cancer unsure of exact location gastro/tn   Social History   Tobacco Use  . Smoking status: Never Smoker  . Smokeless tobacco: Never Used  Substance Use Topics  . Alcohol use: Yes    Alcohol/week: 2.0 standard drinks    Types: 2 Glasses of wine per week    Comment: occ  . Drug use: No      Review of Systems Per HPI    Objective:   Physical Exam  Constitutional: She is oriented to person, place, and time. She appears well-developed and well-nourished.  Non-toxic appearance. She appears ill. No distress.  HENT:  Head: Normocephalic and atraumatic.  Right Ear: Hearing, tympanic membrane and ear canal normal.  Left Ear: Hearing, tympanic membrane and ear canal normal.  Mouth/Throat: Mucous membranes are normal. No uvula swelling. Posterior oropharyngeal erythema present. No oropharyngeal exudate or posterior oropharyngeal edema.  Neck: Normal range of motion. Neck supple.  Cardiovascular: Normal rate, regular rhythm and normal heart  sounds.  Pulmonary/Chest: Effort normal and breath sounds normal.  Neurological: She is alert and oriented to person, place, and time.  Skin: Skin is warm and dry.  Psychiatric: She has a normal mood and affect. Her behavior is normal.  Vitals reviewed.    BP (!) 178/90 (BP Location: Right Arm, Patient Position: Sitting, Cuff Size: Large)   Pulse 76   Temp 98.4 F (36.9 C) (Oral)   Ht 5' 7.4" (1.712 m)   Wt 190 lb 1.9 oz (86.2 kg)   SpO2 99%   BMI 29.42 kg/m  Wt Readings from Last 3 Encounters:  06/18/18 190 lb 1.9 oz (86.2 kg)  05/20/18 189 lb (85.7 kg)  02/28/18 185 lb 8 oz (84.1 kg)   BP Readings from Last 3 Encounters:  06/18/18 (!) 178/90  05/20/18 (!) 146/90  02/28/18 (!) 144/96        Assessment & Plan:  1. Acute non-recurrent frontal sinusitis - Provided written and verbal information regarding diagnosis and treatment. -  Patient Instructions  Try Zyrtec, Afrin type nasal spray twice a day for max 4 days, delsym for cough - azithromycin (ZITHROMAX) 250 MG tablet; Take 2 tabs PO x 1 dose, then 1 tab PO QD x 4 days  Dispense: 6 tablet; Refill: 0  2. Elevated blood pressure reading - likely due to illness, OTC meds - follow up  on file with PCP for Dec. 6, 2019   Clarene Reamer, FNP-BC  [facility]  06/18/2018 10:46 AM

## 2018-06-18 NOTE — Patient Instructions (Addendum)
Try Zyrtec, Afrin type nasal spray twice a day for max 4 days, delsym for cough

## 2018-06-27 ENCOUNTER — Ambulatory Visit: Payer: Managed Care, Other (non HMO) | Admitting: Family Medicine

## 2018-07-04 ENCOUNTER — Ambulatory Visit: Payer: Managed Care, Other (non HMO) | Admitting: Family Medicine

## 2018-07-11 ENCOUNTER — Ambulatory Visit: Payer: Managed Care, Other (non HMO) | Admitting: Family Medicine

## 2018-07-29 DIAGNOSIS — G4733 Obstructive sleep apnea (adult) (pediatric): Secondary | ICD-10-CM

## 2018-07-30 DIAGNOSIS — G4733 Obstructive sleep apnea (adult) (pediatric): Secondary | ICD-10-CM

## 2018-07-31 ENCOUNTER — Telehealth: Payer: Self-pay | Admitting: Internal Medicine

## 2018-07-31 DIAGNOSIS — G4719 Other hypersomnia: Secondary | ICD-10-CM

## 2018-07-31 DIAGNOSIS — G4733 Obstructive sleep apnea (adult) (pediatric): Secondary | ICD-10-CM

## 2018-07-31 NOTE — Telephone Encounter (Signed)
Spoke with patient regarding Sleep Study. Moderate OSA with AHI 16. Recommend auto-CPAP with pressure 5-15 cm H2O. Patient aware. Orders placed.

## 2018-08-05 NOTE — Addendum Note (Signed)
Addended by: Maryanna Shape A on: 08/05/2018 02:10 PM   Modules accepted: Orders

## 2018-08-08 ENCOUNTER — Ambulatory Visit: Payer: Managed Care, Other (non HMO) | Admitting: Family Medicine

## 2018-08-20 ENCOUNTER — Ambulatory Visit: Payer: Managed Care, Other (non HMO) | Admitting: Family Medicine

## 2018-08-20 ENCOUNTER — Encounter: Payer: Self-pay | Admitting: Family Medicine

## 2018-08-20 VITALS — BP 148/92 | HR 78 | Temp 97.6°F | Ht 67.25 in | Wt 192.8 lb

## 2018-08-20 DIAGNOSIS — I10 Essential (primary) hypertension: Secondary | ICD-10-CM

## 2018-08-20 MED ORDER — AMLODIPINE BESYLATE 5 MG PO TABS: 5.0000 mg | ORAL_TABLET | Freq: Every day | ORAL | 11 refills | Status: DC

## 2018-08-20 NOTE — Assessment & Plan Note (Signed)
BP: (!) 148/92  Disc essential HTN and reasons to treat  Family hx -mother had it  Disc goals for control  Enc her to continue working on healthy diet and exercise Px amlodipine 5 mg qd  inst to alert [place] if side effects or problems

## 2018-08-20 NOTE — Patient Instructions (Addendum)
Take amlodipine 5 mg once daily  If you have any side effects- let us know  Dizziness could mean BP is too low   Follow up in 1-2 months   Work on healthy diet and exercise when you can

## 2018-08-20 NOTE — Progress Notes (Signed)
Subjective:    Patient ID: Deborah Orozco, female    DOB: 1956/04/10, 63 y.o.   MRN: [id number]  HPI Here for f/u of chronic medical problems  Doing ok overall  Has been a crazy week- had a tooth ext on Friday  Father moved back in for the winter  Busiest month at work   Just started her cpap   Wt Readings from Last 3 Encounters:  08/20/18 192 lb 12 oz (87.4 kg)  06/18/18 190 lb 1.9 oz (86.2 kg)  05/20/18 189 lb (85.7 kg)   29.96 kg/m   bp is stable today  No cp or palpitations or headaches or edema  No side effects to medicines  BP Readings from Last 3 Encounters:  08/20/18 (!) 148/92  06/18/18 (!) 178/90  05/20/18 (!) 146/90      Struggling with fitting exercise into routine Will be able to go to the gym in feb  Diet has been fair  Eats healthy all day  Binges at 10 pm   Lab Results  Component Value Date   CREATININE 0.81 02/25/2018   BUN 13 02/25/2018   NA 144 02/25/2018   K 4.1 02/25/2018   CL 109 02/25/2018   CO2 29 02/25/2018    Patient Active Problem List   Diagnosis Date Noted  . Hypertension, essential 08/20/2018  . Dysthymia 03/03/2018  . Elevated random blood glucose level 02/28/2018  . Elevated blood-pressure reading without diagnosis of hypertension 08/13/2017  . Fatigue 04/01/2017  . 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 Osteopenia 07/05/2015  . Family history of colon cancer 11/20/2013  . Actinic keratosis 12/12/2012  . Colon cancer screening 07/09/2012  . Breast screening, unspecified 04/08/2012  . Routine general medical examination at a health care facility 12/20/2011  . Vitamin D deficiency 03/14/2010   Past Medical History:  Diagnosis Date  . Post-menopausal    Past Surgical History:  Procedure Laterality Date  . NO PAST SURGERIES     Social History   Tobacco Use  . Smoking status: Never Smoker  . Smokeless tobacco: Never Used  Substance Use Topics  . Alcohol use: Yes    Alcohol/week: 2.0 standard drinks    Types: 2  Glasses of wine per week    Comment: occ  . Drug use: No   Family History  Problem Relation Age of Onset  . Cancer Mother 24       Breast twice recurred at 46/colon/liver  . Diabetes Mother   . Hypertension Mother   . Colon cancer Mother 88  . Hypertension Father   . Heart disease Brother   . Cancer Maternal Grandfather        possible colon cancer unsure of exact location gastro/tn   No Known Allergies Current Outpatient Medications on File Prior to Visit  Medication Sig Dispense Refill  . Cholecalciferol (VITAMIN D-3) 5000 units TABS Take 1 capsule by mouth daily.    . fexofenadine (ALLEGRA) 180 MG tablet Take 180 mg by mouth daily.    . Multiple Vitamin (MULTIVITAMIN) capsule Take by mouth. HERBALIFE    . Probiotic Product (PROBIOTIC PO) Take 1 capsule by mouth every morning.     No current facility-administered medications on file prior to visit.       Review of Systems  Constitutional: Negative for activity change, appetite change, fatigue, fever and unexpected weight change.  HENT: Negative for congestion, ear pain, rhinorrhea, sinus pressure and sore throat.   Eyes: Negative for pain, redness and visual  disturbance.  Respiratory: Negative for cough, shortness of breath and wheezing.   Cardiovascular: Negative for chest pain and palpitations.  Gastrointestinal: Negative for abdominal pain, blood in stool, constipation and diarrhea.  Endocrine: Negative for polydipsia and polyuria.  Genitourinary: Negative for dysuria, frequency and urgency.  Musculoskeletal: Negative for arthralgias, back pain and myalgias.  Skin: Negative for pallor and rash.  Allergic/Immunologic: Negative for environmental allergies.  Neurological: Negative for dizziness, syncope and headaches.  Hematological: Negative for adenopathy. Does not bruise/bleed easily.  Psychiatric/Behavioral: Negative for decreased concentration and dysphoric mood. The patient is not nervous/anxious.        Stressors          Objective:   Physical Exam Constitutional:      General: She is not in acute distress.    Appearance: Normal appearance. She is well-developed. She is obese. She is not ill-appearing.  HENT:     Head: Normocephalic and atraumatic.     Mouth/Throat:     Mouth: Mucous membranes are moist.     Pharynx: Oropharynx is clear.  Eyes:     Conjunctiva/sclera: Conjunctivae normal.     Pupils: Pupils are equal, round, and reactive to light.  Neck:     Musculoskeletal: Normal range of motion and neck supple.     Thyroid: No thyromegaly.     Vascular: No carotid bruit or JVD.  Cardiovascular:     Rate and Rhythm: Normal rate and regular rhythm.     Heart sounds: Normal heart sounds. No gallop.   Pulmonary:     Effort: Pulmonary effort is normal. No respiratory distress.     Breath sounds: Normal breath sounds. No wheezing or rales.  Abdominal:     General: Abdomen is flat. There is no distension or abdominal bruit.  Musculoskeletal:     Right lower leg: No edema.  Lymphadenopathy:     Cervical: No cervical adenopathy.  Skin:    General: Skin is warm and dry.     Findings: No rash.  Neurological:     General: No focal deficit present.     Mental Status: She is alert.     Deep Tendon Reflexes: Reflexes are normal and symmetric. Reflexes normal.  Psychiatric:        Mood and Affect: Mood normal.     Comments: Discusses stressors and life changes this week  Pleasant             Assessment & Plan:   Problem List Items Addressed This Visit      Cardiovascular and Mediastinum   Hypertension, essential - Primary    BP: (!) 148/92  Disc essential HTN and reasons to treat  Family hx -mother had it  Disc goals for control  Enc her to continue working on healthy diet and exercise Px amlodipine 5 mg qd  inst to alert [place] if side effects or problems       Relevant Medications   amLODipine (NORVASC) 5 MG tablet

## 2018-10-24 ENCOUNTER — Ambulatory Visit: Payer: Self-pay | Admitting: Family Medicine

## 2018-11-25 ENCOUNTER — Telehealth: Payer: Self-pay | Admitting: Family Medicine

## 2018-11-25 NOTE — Telephone Encounter (Signed)
Please have her do a drive through BP check before her virtual visit, thanks

## 2018-11-25 NOTE — Telephone Encounter (Signed)
I left a message on patient's v.m. asking patient to return my call about her upcoming appointment.  If patient returns my call, please change 12/05/18 f/u appointment to [email or web address].

## 2018-11-25 NOTE — Telephone Encounter (Signed)
Best number [phone] Pt has bp follow up on 5/15  Has not way to check her bp at home.  Pt stated she is doing fine.  Do you want pt to come in office or r/s

## 2018-11-26 NOTE — Telephone Encounter (Signed)
Spoke with patient, changed appointment to virtual and nurse visit prior that day for vitals.

## 2018-12-05 ENCOUNTER — Encounter: Payer: Self-pay | Admitting: Family Medicine

## 2018-12-05 ENCOUNTER — Ambulatory Visit: Payer: Managed Care, Other (non HMO)

## 2018-12-05 ENCOUNTER — Ambulatory Visit (INDEPENDENT_AMBULATORY_CARE_PROVIDER_SITE_OTHER): Payer: Managed Care, Other (non HMO) | Admitting: Family Medicine

## 2018-12-05 ENCOUNTER — Ambulatory Visit: Payer: Self-pay | Admitting: Family Medicine

## 2018-12-05 VITALS — BP 132/82 | HR 78 | Temp 98.1°F

## 2018-12-05 DIAGNOSIS — I10 Essential (primary) hypertension: Secondary | ICD-10-CM

## 2018-12-05 NOTE — Progress Notes (Signed)
Virtual Visit via Video Note  I connected with [name] on 12/05/18 at 10:00 AM EDT by a video enabled telemedicine application and verified that I am speaking with the correct person using two identifiers.  Location: Patient: home Provider: office   Video visit was attempted- could not make audio work on her laptop- so we conducted the rest of the visit by phone    I discussed the limitations of evaluation and management by telemedicine and the availability of in person appointments. The patient expressed understanding and agreed to proceed.  History of Present Illness: Patient presents for f/u of chronic medical problems   Doing well   Wt Readings from Last 3 Encounters:  08/20/18 192 lb 12 oz (87.4 kg)  06/18/18 190 lb 1.9 oz (86.2 kg)  05/20/18 189 lb (85.7 kg)  she thinks she may be down a few lb   bp is improved today No cp or palpitations or headaches or edema  No side effects to medicines  BP Readings from Last 3 Encounters:  12/05/18 132/82  08/20/18 (!) 148/92  06/18/18 (!) 178/90    Last visit started amlodipine for HTN with family history  She does not have a cuff -does not check at home  A few times- light headed (towards end of walk) -not happening often (3 times and brief)  Not positional  Has been walking a lot  No leg swelling  She has been drinking lots of water   Eating healthier  Still too much late at night    Lab Results  Component Value Date   CREATININE 0.81 02/25/2018   BUN 13 02/25/2018   NA 144 02/25/2018   K 4.1 02/25/2018   CL 109 02/25/2018   CO2 29 02/25/2018   Lab Results  Component Value Date   ALT 18 02/25/2018   AST 14 02/25/2018   ALKPHOS 63 02/25/2018   BILITOT 0.6 02/25/2018    Pt has sept PE with labs planned   Review of Systems  Constitutional: Negative for chills, diaphoresis, fever, malaise/fatigue and weight loss.  Eyes: Negative for blurred vision.  Respiratory: Negative for cough and shortness of  breath.   Cardiovascular: Negative for chest pain, palpitations and leg swelling.  Gastrointestinal: Negative for heartburn and nausea.  Skin: Negative for rash.  Neurological: Negative for dizziness, weakness and headaches.      Patient Active Problem List   Diagnosis Date Noted  . Hypertension, essential 08/20/2018  . Dysthymia 03/03/2018  . Elevated random blood glucose level 02/28/2018  . Elevated blood-pressure reading without diagnosis of hypertension 08/13/2017  . Fatigue 04/01/2017  . 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 Osteopenia 07/05/2015  . Family history of colon cancer 11/20/2013  . Actinic keratosis 12/12/2012  . Colon cancer screening 07/09/2012  . Breast screening, unspecified 04/08/2012  . Routine general medical examination at a health care facility 12/20/2011  . Vitamin D deficiency 03/14/2010   Past Medical History:  Diagnosis Date  . Post-menopausal    Past Surgical History:  Procedure Laterality Date  . NO PAST SURGERIES     Social History   Tobacco Use  . Smoking status: Never Smoker  . Smokeless tobacco: Never Used  Substance Use Topics  . Alcohol use: Yes    Alcohol/week: 2.0 standard drinks    Types: 2 Glasses of wine per week    Comment: occ  . Drug use: No   Family History  Problem Relation Age of Onset  . Cancer Mother 29  Breast twice recurred at 46/colon/liver  . Diabetes Mother   . Hypertension Mother   . Colon cancer Mother 43  . Hypertension Father   . Heart disease Brother   . Cancer Maternal Grandfather        possible colon cancer unsure of exact location gastro/tn   No Known Allergies Current Outpatient Medications on File Prior to Visit  Medication Sig Dispense Refill  . amLODipine (NORVASC) 5 MG tablet Take 1 tablet (5 mg total) by mouth daily. 30 tablet 11  . Cholecalciferol (VITAMIN D-3) 5000 units TABS Take 1 capsule by mouth daily.    . fexofenadine (ALLEGRA) 180 MG tablet Take 180 mg by mouth daily.    .  Multiple Vitamin (MULTIVITAMIN) capsule Take by mouth. HERBALIFE    . Probiotic Product (PROBIOTIC PO) Take 1 capsule by mouth every morning.     No current facility-administered medications on file prior to visit.     Observations/Objective: For the video portion of the encounter pt appeared well and at baseline weight  No facial swelling or changes  No pallor or rash  No obvious tremor  Voice is baseline-not hoarse and no slurred speech  Mentally sharp with normal affect - cheerful  No sob or cough during interview   Assessment and Plan: Problem List Items Addressed This Visit      Cardiovascular and Mediastinum   Hypertension, essential    bp in fair control at this time -improved with amlodipine BP Readings from Last 1 Encounters:  12/05/18 132/82   No changes needed, however if episodes of light headedness continue to occur (she does not have a cuff to check bp)- inst to cut amlodipine in 1/2 and take 2.5 mg daily and let us know Otherwise f/u for PE and labs in sept as planned Enc good fluid intake especially with outdoor exercise  Most recent labs reviewed  Disc lifstyle change with low sodium diet and exercise            Follow Up Instructions: Blood pressure is looking better with amlodipine If the episodes of light headedness increase or do not resolve in 3-4 weeks please cut dose to 2.5 mg daily (half of your pill) Keep walking  Watch diet for processed foods Keep fluid intake up  Check bp out of office if you get a chance   Alert [place] if any issues Otherwise we will see you in sept    I discussed the assessment and treatment plan with the patient. The patient was provided an opportunity to ask questions and all were answered. The patient agreed with the plan and demonstrated an understanding of the instructions.   The patient was advised to call back or seek an in-person evaluation if the symptoms worsen or if the condition fails to improve as  anticipated.  Loura Pardon, MD

## 2018-12-05 NOTE — Assessment & Plan Note (Signed)
bp in fair control at this time -improved with amlodipine BP Readings from Last 1 Encounters:  12/05/18 132/82   No changes needed, however if episodes of light headedness continue to occur (she does not have a cuff to check bp)- inst to cut amlodipine in 1/2 and take 2.5 mg daily and let us know Otherwise f/u for PE and labs in sept as planned Enc good fluid intake especially with outdoor exercise  Most recent labs reviewed  Disc lifstyle change with low sodium diet and exercise

## 2018-12-05 NOTE — Patient Instructions (Signed)
Blood pressure is looking better with amlodipine If the episodes of light headedness increase or do not resolve in 3-4 weeks please cut dose to 2.5 mg daily (half of your pill) Keep walking  Watch diet for processed foods Keep fluid intake up  Check bp out of office if you get a chance   Alert [place] if any issues Otherwise we will see you in sept

## 2018-12-10 ENCOUNTER — Telehealth: Payer: Self-pay

## 2018-12-10 MED ORDER — AMLODIPINE BESYLATE 2.5 MG PO TABS: 2.5000 mg | ORAL_TABLET | Freq: Every day | ORAL | 11 refills | Status: DC

## 2018-12-10 NOTE — Telephone Encounter (Signed)
Pt left v/m; pt was seen 12/05/18 and amlodipine 5mg  was decreased to 2.5 mg and pt has been cutting pill in half but the pill is crumbling. Pt wants to know if Amlodipine 2.5 mg can be sent to [facility] so pt will  Not have to cut pills. Pt request cb.Please advise.

## 2018-12-10 NOTE — Telephone Encounter (Signed)
Done

## 2018-12-12 ENCOUNTER — Ambulatory Visit: Payer: Self-pay | Admitting: *Deleted

## 2018-12-12 ENCOUNTER — Ambulatory Visit: Payer: Managed Care, Other (non HMO) | Admitting: Family Medicine

## 2018-12-12 ENCOUNTER — Other Ambulatory Visit: Payer: Self-pay

## 2018-12-12 ENCOUNTER — Encounter: Payer: Self-pay | Admitting: Family Medicine

## 2018-12-12 VITALS — BP 148/100 | HR 86 | Temp 98.7°F | Ht 67.0 in | Wt 195.0 lb

## 2018-12-12 DIAGNOSIS — R42 Dizziness and giddiness: Secondary | ICD-10-CM

## 2018-12-12 DIAGNOSIS — I10 Essential (primary) hypertension: Secondary | ICD-10-CM | POA: Diagnosis not present

## 2018-12-12 MED ORDER — LISINOPRIL 10 MG PO TABS: 10.0000 mg | ORAL_TABLET | Freq: Every day | ORAL | 3 refills | Status: DC

## 2018-12-12 NOTE — Progress Notes (Signed)
Subjective:    Patient ID: Deborah Orozco, female    DOB: 03-May-1956, 63 y.o.   MRN: [id number]  HPI  Here for visit for HTN /dizziness/vision change   At last visit she was put on amlodipine for HTN  She experienced dizziness and cut dose from 5 mg to 2.5 mg   Started 1/2 dose on Monday  No changes in dizziness  Not spinning / more light headed  Feels fuzzy in the head  Vision is affected - she feels pressure behind her eyes / like a headache and vision just is not as clear as usual   A little winded when she walks up her stairs  No ankle swelling  No cp  No palpitations   Has been walking regularly until the rain     Wt Readings from Last 3 Encounters:  12/12/18 195 lb (88.5 kg)  08/20/18 192 lb 12 oz (87.4 kg)  06/18/18 190 lb 1.9 oz (86.2 kg)   30.54 kg/m   BP Readings from Last 3 Encounters:  12/12/18 (!) 148/100  12/05/18 132/82  08/20/18 (!) 148/92   She gave blood on Sunday and it was 138/80   Mother has carotid dz   Lab Results  Component Value Date   CREATININE 0.81 02/25/2018   BUN 13 02/25/2018   NA 144 02/25/2018   K 4.1 02/25/2018   CL 109 02/25/2018   CO2 29 02/25/2018   Lab Results  Component Value Date   CHOL 181 02/25/2018   HDL 74.60 02/25/2018   LDLCALC 90 02/25/2018   LDLDIRECT 133.1 03/14/2010   TRIG 81.0 02/25/2018   CHOLHDL 2 02/25/2018    Pulse Readings from Last 3 Encounters:  12/12/18 86  12/05/18 78  08/20/18 78   EKG today NSR with rate of 76 and no acute changes   Patient Active Problem List   Diagnosis Date Noted   Hypertension, essential 08/20/2018   Dysthymia 03/03/2018   Elevated random blood glucose level 02/28/2018   Elevated blood-pressure reading without diagnosis of hypertension 08/13/2017   Fatigue 04/01/2017   Osteopenia 07/05/2015   Family history of colon cancer 11/20/2013   Actinic keratosis 12/12/2012   Colon cancer screening 07/09/2012   Breast screening, unspecified 04/08/2012     Routine general medical examination at a health care facility 12/20/2011   Vitamin D deficiency 03/14/2010   Past Medical History:  Diagnosis Date   Post-menopausal    Past Surgical History:  Procedure Laterality Date   NO PAST SURGERIES     Social History   Tobacco Use   Smoking status: Never Smoker   Smokeless tobacco: Never Used  Substance Use Topics   Alcohol use: Yes    Alcohol/week: 2.0 standard drinks    Types: 2 Glasses of wine per week    Comment: occ   Drug use: No   Family History  Problem Relation Age of Onset   Cancer Mother 40       Breast twice recurred at 46/colon/liver   Diabetes Mother    Hypertension Mother    Colon cancer Mother 47   Hypertension Father    Heart disease Brother    Cancer Maternal Grandfather        possible colon cancer unsure of exact location gastro/tn   Allergies  Allergen Reactions   Amlodipine     dizzy   Current Outpatient Medications on File Prior to Visit  Medication Sig Dispense Refill   Cholecalciferol (VITAMIN D-3) 5000 units TABS Take  1 capsule by mouth daily.     Multiple Vitamin (MULTIVITAMIN) capsule Take by mouth. HERBALIFE     Probiotic Product (PROBIOTIC PO) Take 1 capsule by mouth every morning.     fexofenadine (ALLEGRA) 180 MG tablet Take 180 mg by mouth daily.     No current facility-administered medications on file prior to visit.      Review of Systems  Constitutional: Positive for fatigue. Negative for activity change, appetite change, fever and unexpected weight change.  HENT: Negative for congestion, ear pain, rhinorrhea, sinus pressure and sore throat.   Eyes: Negative for pain, redness and visual disturbance.  Respiratory: Negative for cough, shortness of breath and wheezing.        Occ winded walking stairs -not unusual  Cardiovascular: Negative for chest pain and palpitations.  Gastrointestinal: Negative for abdominal pain, blood in stool, constipation and diarrhea.   Endocrine: Negative for polydipsia and polyuria.  Genitourinary: Negative for dysuria, frequency and urgency.  Musculoskeletal: Negative for arthralgias, back pain and myalgias.  Skin: Negative for pallor and rash.  Allergic/Immunologic: Negative for environmental allergies.  Neurological: Positive for dizziness. Negative for tremors, seizures, syncope, facial asymmetry, speech difficulty, weakness, light-headedness, numbness and headaches.  Hematological: Negative for adenopathy. Does not bruise/bleed easily.  Psychiatric/Behavioral: Negative for decreased concentration and dysphoric mood. The patient is not nervous/anxious.        Objective:   Physical Exam Constitutional:      General: She is not in acute distress.    Appearance: Normal appearance. She is well-developed. She is obese. She is not ill-appearing or diaphoretic.  HENT:     Head: Normocephalic and atraumatic.     Right Ear: Tympanic membrane, ear canal and external ear normal.     Left Ear: Tympanic membrane, ear canal and external ear normal.     Nose: Nose normal. No congestion or rhinorrhea.     Mouth/Throat:     Mouth: Mucous membranes are moist.     Pharynx: Oropharynx is clear. No oropharyngeal exudate.  Eyes:     General: No scleral icterus.       Right eye: No discharge.        Left eye: No discharge.     Conjunctiva/sclera: Conjunctivae normal.     Pupils: Pupils are equal, round, and reactive to light.     Comments: No nystagmus  Neck:     Musculoskeletal: Full passive range of motion without pain, normal range of motion and neck supple.     Thyroid: No thyromegaly.     Vascular: No carotid bruit or JVD.     Trachea: No tracheal deviation.  Cardiovascular:     Rate and Rhythm: Normal rate and regular rhythm.     Heart sounds: Normal heart sounds. No murmur.  Pulmonary:     Effort: Pulmonary effort is normal. No respiratory distress.     Breath sounds: Normal breath sounds. No stridor. No wheezing,  rhonchi or rales.  Abdominal:     General: Bowel sounds are normal. There is no distension.     Palpations: Abdomen is soft. There is no mass.     Tenderness: There is no abdominal tenderness.  Musculoskeletal:        General: No tenderness.     Right lower leg: No edema.     Left lower leg: No edema.  Lymphadenopathy:     Cervical: No cervical adenopathy.  Skin:    General: Skin is warm and dry.     Capillary  Refill: Capillary refill takes less than 2 seconds.     Coloration: Skin is not pale.     Findings: No rash.  Neurological:     Mental Status: She is alert and oriented to person, place, and time. Mental status is at baseline.     Cranial Nerves: No cranial nerve deficit.     Sensory: No sensory deficit.     Motor: No weakness, tremor, atrophy or abnormal muscle tone.     Coordination: Coordination normal.     Gait: Gait normal.     Deep Tendon Reflexes: Reflexes are normal and symmetric. Reflexes normal.     Comments: No focal cerebellar signs   Psychiatric:        Behavior: Behavior normal.        Thought Content: Thought content normal.           Assessment & Plan:   Problem List Items Addressed This Visit      Cardiovascular and Mediastinum   Hypertension, essential - Primary    bp is elevated after cutting amlodipine to 2.5 mg  Also-may be having side effects/ dizzy and fuzzy headed  inst to stop amlodipine Start lisinopril 10 mg daily  Disc poss side eff incl low bp and cough (will update) Disc lifestyle change for HTN      Relevant Medications   lisinopril (ZESTRIL) 10 MG tablet     Other   Dizziness    Difficult to describe-may be more fuzzy headedness  Began with initiation of amlodipine  Poss side effect  Nl exam /neuro exam  Will change bp med to lisinopril  Also see if symptoms imp with better bp control  Update if not starting to improve in a week or if worsening        Relevant Orders   EKG 12-Lead (Completed)

## 2018-12-12 NOTE — Telephone Encounter (Signed)
Spoke with patient. She cannot come after 2pm today due to need to travel to [place].  States her symptoms are not emergent but she is concerned about her blood pressure.   Discussed symptoms with Dr. Glori Bickers. Dr. Glori Bickers will work her in today at 1pm for a face to face ov.   Patient made aware. She will wear a mask and did screen negative for COVID type symptoms.

## 2018-12-12 NOTE — Telephone Encounter (Signed)
Patient is calling to report she is having increasing dizziness- patient states she has had a recent change in her BP medication but is unable to check it because she does not have a BP monitor at the home. Patient states her dizziness is also accompanied by visual effects and headaches. These occurrences come and go and are not constant.

## 2018-12-12 NOTE — Telephone Encounter (Signed)
   Reason for Disposition . [1] MODERATE dizziness (e.g., interferes with normal activities) AND [2] has NOT been evaluated by physician for this  (Exception: dizziness caused by heat exposure, sudden standing, or poor fluid intake)  Answer Assessment - Initial Assessment Questions 1. DESCRIPTION: "Describe your dizziness."     Patient feels weak- effects eyesight- strange feeling in eyes, feels winded gong up steps, queezy at times 2. LIGHTHEADED: "Do you feel lightheaded?" (e.g., somewhat faint, woozy, weak upon standing)     Woozy, not feeling steady- not right- comes and goes 3. VERTIGO: "Do you feel like either you or the room is spinning or tilting?" (i.e. vertigo)     no 4. SEVERITY: "How bad is it?"  "Do you feel like you are going to faint?" "Can you stand and walk?"   - MILD - walking normally   - MODERATE - interferes with normal activities (e.g., work, school)    - SEVERE - unable to stand, requires support to walk, feels like passing out now.      Mild/moderate- happens 1-2 times/day 5. ONSET:  "When did the dizziness begin?"     Past month patient states she started feeling it 1-2 times/week- now 1-2 /day 6. AGGRAVATING FACTORS: "Does anything make it worse?" (e.g., standing, change in head position)     If experiencing it- exertion can make it worse- she also will have underlying headache 7. HEART RATE: "Can you tell me your heart rate?" "How many beats in 15 seconds?"  (Note: not all patients can do this)       No change in heartrate that she can tell 8. CAUSE: "What do you think is causing the dizziness?"     possible change is BP medication 9. RECURRENT SYMPTOM: "Have you had dizziness before?" If so, ask: "When was the last time?" "What happened that time?"     no 10. OTHER SYMPTOMS: "Do you have any other symptoms?" (e.g., fever, chest pain, vomiting, diarrhea, bleeding)       no 11. PREGNANCY: "Is there any chance you are pregnant?" "When was your last menstrual  period?"       n/a  Protocols used: DIZZINESS [facility]

## 2018-12-12 NOTE — Patient Instructions (Signed)
Stop amlodipine  Start lisinopril 10 mg once daily in am  Drink water  Avoid salty stuff  When feeling better get back to exercise Update [place] early next week with how you are feeling   Let's check back in a month to see if we need to tweak anything   If dizziness persists let me know   If symptoms suddenly worsen go to the emergency room

## 2018-12-14 DIAGNOSIS — R42 Dizziness and giddiness: Secondary | ICD-10-CM | POA: Insufficient documentation

## 2018-12-14 NOTE — Assessment & Plan Note (Signed)
bp is elevated after cutting amlodipine to 2.5 mg  Also-may be having side effects/ dizzy and fuzzy headed  inst to stop amlodipine Start lisinopril 10 mg daily  Disc poss side eff incl low bp and cough (will update) Disc lifestyle change for HTN

## 2018-12-14 NOTE — Assessment & Plan Note (Signed)
Difficult to describe-may be more fuzzy headedness  Began with initiation of amlodipine  Poss side effect  Nl exam /neuro exam  Will change bp med to lisinopril  Also see if symptoms imp with better bp control  Update if not starting to improve in a week or if worsening

## 2019-03-29 ENCOUNTER — Other Ambulatory Visit: Payer: Self-pay | Admitting: Family Medicine

## 2019-04-02 ENCOUNTER — Telehealth: Payer: Self-pay | Admitting: Family Medicine

## 2019-04-02 DIAGNOSIS — R7309 Other abnormal glucose: Secondary | ICD-10-CM

## 2019-04-02 DIAGNOSIS — I10 Essential (primary) hypertension: Secondary | ICD-10-CM

## 2019-04-02 DIAGNOSIS — E559 Vitamin D deficiency, unspecified: Secondary | ICD-10-CM

## 2019-04-02 NOTE — Telephone Encounter (Signed)
-----   Message from Cloyd Stagers, RT sent at 03/24/2019  1:34 PM EDT ----- Regarding: Lab Orders for Friday 9.11.2020 Please place lab orders for Friday 9.11.2020, office visit for physical on Friday 9.18.2020 Thank you, Dyke Maes RT(R)

## 2019-04-03 ENCOUNTER — Other Ambulatory Visit: Payer: Self-pay

## 2019-04-03 ENCOUNTER — Telehealth: Payer: Self-pay

## 2019-04-03 NOTE — Telephone Encounter (Signed)
Left message to call clinic, needs COVID screen and back door lab info and front door info

## 2019-04-06 ENCOUNTER — Telehealth: Payer: Self-pay | Admitting: Family Medicine

## 2019-04-06 DIAGNOSIS — R7309 Other abnormal glucose: Secondary | ICD-10-CM

## 2019-04-06 DIAGNOSIS — E559 Vitamin D deficiency, unspecified: Secondary | ICD-10-CM

## 2019-04-06 DIAGNOSIS — I10 Essential (primary) hypertension: Secondary | ICD-10-CM

## 2019-04-06 NOTE — Telephone Encounter (Signed)
-----   Message from Cloyd Stagers, RT sent at 04/01/2019  2:20 PM EDT ----- Regarding: Lab Orders for Tuesday 9.15.2020 Please place lab orders for Tuesday 9.15.2020, office visit for physical on Friday 9.18.2020 Thank you, Dyke Maes RT(R)

## 2019-04-07 ENCOUNTER — Other Ambulatory Visit (INDEPENDENT_AMBULATORY_CARE_PROVIDER_SITE_OTHER): Payer: Managed Care, Other (non HMO)

## 2019-04-07 DIAGNOSIS — R7309 Other abnormal glucose: Secondary | ICD-10-CM

## 2019-04-07 DIAGNOSIS — E559 Vitamin D deficiency, unspecified: Secondary | ICD-10-CM | POA: Diagnosis not present

## 2019-04-07 DIAGNOSIS — I10 Essential (primary) hypertension: Secondary | ICD-10-CM | POA: Diagnosis not present

## 2019-04-07 LAB — CBC WITH DIFFERENTIAL/PLATELET
Basophils Absolute: 0.1 10*3/uL (ref 0.0–0.1)
Basophils Relative: 1 % (ref 0.0–3.0)
Eosinophils Absolute: 0.4 10*3/uL (ref 0.0–0.7)
Eosinophils Relative: 5.7 % — ABNORMAL HIGH (ref 0.0–5.0)
HCT: 36.2 % (ref 36.0–46.0)
Hemoglobin: 12 g/dL (ref 12.0–15.0)
Lymphocytes Relative: 27.1 % (ref 12.0–46.0)
Lymphs Abs: 1.7 10*3/uL (ref 0.7–4.0)
MCHC: 33 g/dL (ref 30.0–36.0)
MCV: 83.6 fl (ref 78.0–100.0)
Monocytes Absolute: 0.4 10*3/uL (ref 0.1–1.0)
Monocytes Relative: 6.8 % (ref 3.0–12.0)
Neutro Abs: 3.8 10*3/uL (ref 1.4–7.7)
Neutrophils Relative %: 59.4 % (ref 43.0–77.0)
Platelets: 258 10*3/uL (ref 150.0–400.0)
RBC: 4.33 Mil/uL (ref 3.87–5.11)
RDW: 15.3 % (ref 11.5–15.5)
WBC: 6.4 10*3/uL (ref 4.0–10.5)

## 2019-04-07 LAB — COMPREHENSIVE METABOLIC PANEL
ALT: 13 U/L (ref 0–35)
AST: 14 U/L (ref 0–37)
Albumin: 3.8 g/dL (ref 3.5–5.2)
Alkaline Phosphatase: 61 U/L (ref 39–117)
BUN: 9 mg/dL (ref 6–23)
CO2: 28 mEq/L (ref 19–32)
Calcium: 8.9 mg/dL (ref 8.4–10.5)
Chloride: 108 mEq/L (ref 96–112)
Creatinine, Ser: 0.75 mg/dL (ref 0.40–1.20)
GFR: 78.49 mL/min (ref >60.00)
Glucose, Bld: 72 mg/dL (ref 70–99)
Potassium: 3.4 mEq/L — ABNORMAL LOW (ref 3.5–5.1)
Sodium: 143 mEq/L (ref 135–145)
Total Bilirubin: 0.4 mg/dL (ref 0.2–1.2)
Total Protein: 5.7 g/dL — ABNORMAL LOW (ref 6.0–8.3)

## 2019-04-07 LAB — HEMOGLOBIN A1C: Hgb A1c MFr Bld: 5.6 % (ref 4.6–6.5)

## 2019-04-07 LAB — LIPID PANEL
Cholesterol: 158 mg/dL (ref 0–200)
HDL: 53.6 mg/dL (ref >39.00)
LDL Cholesterol: 76 mg/dL (ref 0–99)
NonHDL: 104.26
Total CHOL/HDL Ratio: 3
Triglycerides: 141 mg/dL (ref 0.0–149.0)
VLDL: 28.2 mg/dL (ref 0.0–40.0)

## 2019-04-07 LAB — VITAMIN D 25 HYDROXY (VIT D DEFICIENCY, FRACTURES): VITD: 65.55 ng/mL (ref 30.00–100.00)

## 2019-04-07 LAB — TSH: TSH: 2.17 u[IU]/mL (ref 0.35–4.50)

## 2019-04-10 ENCOUNTER — Other Ambulatory Visit: Payer: Self-pay

## 2019-04-10 ENCOUNTER — Ambulatory Visit (INDEPENDENT_AMBULATORY_CARE_PROVIDER_SITE_OTHER): Payer: Managed Care, Other (non HMO) | Admitting: Family Medicine

## 2019-04-10 ENCOUNTER — Encounter: Payer: Self-pay | Admitting: Family Medicine

## 2019-04-10 VITALS — BP 122/80 | HR 80 | Temp 97.9°F | Ht 67.0 in | Wt 178.4 lb

## 2019-04-10 DIAGNOSIS — Z23 Encounter for immunization: Secondary | ICD-10-CM

## 2019-04-10 DIAGNOSIS — I10 Essential (primary) hypertension: Secondary | ICD-10-CM | POA: Diagnosis not present

## 2019-04-10 DIAGNOSIS — R7309 Other abnormal glucose: Secondary | ICD-10-CM

## 2019-04-10 DIAGNOSIS — Z800 Family history of malignant neoplasm of digestive organs: Secondary | ICD-10-CM

## 2019-04-10 DIAGNOSIS — Z0000 Encounter for general adult medical examination without abnormal findings: Secondary | ICD-10-CM | POA: Diagnosis not present

## 2019-04-10 DIAGNOSIS — E559 Vitamin D deficiency, unspecified: Secondary | ICD-10-CM

## 2019-04-10 DIAGNOSIS — M85859 Other specified disorders of bone density and structure, unspecified thigh: Secondary | ICD-10-CM | POA: Diagnosis not present

## 2019-04-10 DIAGNOSIS — Z1211 Encounter for screening for malignant neoplasm of colon: Secondary | ICD-10-CM

## 2019-04-10 MED ORDER — LISINOPRIL 10 MG PO TABS: 10.0000 mg | ORAL_TABLET | Freq: Every day | ORAL | 3 refills | Status: DC

## 2019-04-10 NOTE — Patient Instructions (Addendum)
Make an appointment with Dr Lorelei Pont on the way out   Look into coverage for the Shingrix vaccine- if covered - call us to schedule the vaccine   Don't forget [facility] with [facility]  You are due for a mammogram   Our office will call you about scheduling a colonoscopy   Keep up the good work with diet/exercise

## 2019-04-10 NOTE — Progress Notes (Signed)
Subjective:    Patient ID: Deborah Orozco, female    DOB: 26-May-1956, 63 y.o.   MRN: [id number]  HPI Here for health maintenance exam and to review chronic medical problems    Weight Wt Readings from Last 3 Encounters:  04/10/19 178 lb 7 oz (80.9 kg)  12/12/18 195 lb (88.5 kg)  08/20/18 192 lb 12 oz (87.4 kg)  lost weight  Did nutra system and did well with it (with her husband)  Feels great  Exercise -walking  (some foot pain)  27.95 kg/m    Foot and shoulder pain (right shoulder) - wants to see sport med   Flu vaccine - today  Is interested in shingrix if covered   [facility]  Self breast exam - no lumps   Pap 2/19 neg with neg [facility]   Colonoscopy 7/15  Has a family h/o colon cancer Needs it scheduled   dexa 1/19  Osteopenia FN Managed by gyn  D level is 65.5  Taking her vit D Walking  No falls  No fractures   Tdap 8/19   bp is stable today  No cp or palpitations or headaches or edema  No side effects to medicines  BP Readings from Last 3 Encounters:  04/10/19 122/80  12/12/18 (!) 148/100  12/05/18 132/82     Lab Results  Component Value Date   CREATININE 0.75 04/07/2019   BUN 9 04/07/2019   NA 143 04/07/2019   K 3.4 (L) 04/07/2019   CL 108 04/07/2019   CO2 28 04/07/2019   Lab Results  Component Value Date   ALT 13 04/07/2019   AST 14 04/07/2019   ALKPHOS 61 04/07/2019   BILITOT 0.4 04/07/2019   Lab Results  Component Value Date   WBC 6.4 04/07/2019   HGB 12.0 04/07/2019   HCT 36.2 04/07/2019   MCV 83.6 04/07/2019   PLT 258.0 04/07/2019   Lab Results  Component Value Date   TSH 2.17 04/07/2019    H/o elevated random glucose Lab Results  Component Value Date   HGBA1C 5.6 04/07/2019  glucose 72  Eating better and lost weight   Cholesterol Lab Results  Component Value Date   CHOL 158 04/07/2019   CHOL 181 02/25/2018   CHOL 187 06/28/2015   Lab Results  Component Value Date   HDL 53.60 04/07/2019   HDL 74.60 02/25/2018   HDL 63.00 06/28/2015   Lab Results  Component Value Date   LDLCALC 76 04/07/2019   LDLCALC 90 02/25/2018   LDLCALC 107 (H) 06/28/2015   Lab Results  Component Value Date   TRIG 141.0 04/07/2019   TRIG 81.0 02/25/2018   TRIG 88.0 06/28/2015   Lab Results  Component Value Date   CHOLHDL 3 04/07/2019   CHOLHDL 2 02/25/2018   CHOLHDL 3 06/28/2015   Lab Results  Component Value Date   LDLDIRECT 133.1 03/14/2010    Patient Active Problem List   Diagnosis Date Noted  . Dizziness 12/14/2018  . Hypertension, essential 08/20/2018  . Dysthymia 03/03/2018  . Elevated random blood glucose level 02/28/2018  . Fatigue 04/01/2017  . Osteopenia 07/05/2015  . Family history of colon cancer 11/20/2013  . Actinic keratosis 12/12/2012  . Colon cancer screening 07/09/2012  . Breast screening, unspecified 04/08/2012  . Routine general medical examination at a health care facility 12/20/2011  . Vitamin D deficiency 03/14/2010   Past Medical History:  Diagnosis Date  . Post-menopausal  Past Surgical History:  Procedure Laterality Date  . NO PAST SURGERIES     Social History   Tobacco Use  . Smoking status: Never Smoker  . Smokeless tobacco: Never Used  Substance Use Topics  . Alcohol use: Yes    Alcohol/week: 2.0 standard drinks    Types: 2 Glasses of wine per week    Comment: occ  . Drug use: No   Family History  Problem Relation Age of Onset  . Cancer Mother 17       Breast twice recurred at 46/colon/liver  . Diabetes Mother   . Hypertension Mother   . Colon cancer Mother 38  . Hypertension Father   . Heart disease Brother   . Cancer Maternal Grandfather        possible colon cancer unsure of exact location gastro/tn   Allergies  Allergen Reactions  . Amlodipine     dizzy   Current Outpatient Medications on File Prior to Visit  Medication Sig Dispense Refill  . Cholecalciferol (VITAMIN D-3) 5000 units TABS Take  1 capsule by mouth daily.    . fluticasone (FLONASE) 50 MCG/ACT nasal spray Place into both nostrils daily as needed for allergies or rhinitis.    . Multiple Vitamin (MULTIVITAMIN) capsule Take by mouth. HERBALIFE    . Probiotic Product (PROBIOTIC PO) Take 1 capsule by mouth every morning.     No current facility-administered medications on file prior to visit.      Review of Systems  Constitutional: Negative for activity change, appetite change, fatigue, fever and unexpected weight change.  HENT: Negative for congestion, ear pain, rhinorrhea, sinus pressure and sore throat.   Eyes: Negative for pain, redness and visual disturbance.  Respiratory: Negative for cough, shortness of breath and wheezing.   Cardiovascular: Negative for chest pain and palpitations.  Gastrointestinal: Negative for abdominal pain, blood in stool, constipation and diarrhea.  Endocrine: Negative for polydipsia and polyuria.  Genitourinary: Negative for dysuria, frequency and urgency.  Musculoskeletal: Positive for arthralgias. Negative for back pain and myalgias.       Pain in foot and shoulder  Skin: Negative for pallor and rash.  Allergic/Immunologic: Negative for environmental allergies.  Neurological: Negative for dizziness, syncope and headaches.  Hematological: Negative for adenopathy. Does not bruise/bleed easily.  Psychiatric/Behavioral: Negative for decreased concentration and dysphoric mood. The patient is not nervous/anxious.        Objective:   Physical Exam Constitutional:      General: She is not in acute distress.    Appearance: Normal appearance. She is well-developed. She is not ill-appearing or diaphoretic.  HENT:     Head: Normocephalic and atraumatic.     Right Ear: Tympanic membrane, ear canal and external ear normal.     Left Ear: Tympanic membrane, ear canal and external ear normal.     Nose: Nose normal. No congestion.     Mouth/Throat:     Mouth: Mucous membranes are moist.      Pharynx: Oropharynx is clear. No posterior oropharyngeal erythema.  Eyes:     General: No scleral icterus.    Extraocular Movements: Extraocular movements intact.     Conjunctiva/sclera: Conjunctivae normal.     Pupils: Pupils are equal, round, and reactive to light.  Neck:     Musculoskeletal: Normal range of motion and neck supple. No neck rigidity or muscular tenderness.     Thyroid: No thyromegaly.     Vascular: No carotid bruit or JVD.  Cardiovascular:  Rate and Rhythm: Normal rate and regular rhythm.     Pulses: Normal pulses.     Heart sounds: Normal heart sounds. No gallop.   Pulmonary:     Effort: Pulmonary effort is normal. No respiratory distress.     Breath sounds: Normal breath sounds. No wheezing.     Comments: Good air exch Chest:     Chest wall: No tenderness.  Abdominal:     General: Bowel sounds are normal. There is no distension or abdominal bruit.     Palpations: Abdomen is soft. There is no mass.     Tenderness: There is no abdominal tenderness.     Hernia: No hernia is present.  Genitourinary:    Comments: Breast exam: No mass, nodules, thickening, tenderness, bulging, retraction, inflamation, nipple discharge or skin changes noted.  No axillary or clavicular LA.      Musculoskeletal: Normal range of motion.        General: No tenderness.     Right lower leg: No edema.     Left lower leg: No edema.     Comments: Limited rom of R shoulder due to discomfort  Lymphadenopathy:     Cervical: No cervical adenopathy.  Skin:    General: Skin is warm and dry.     Coloration: Skin is not pale.     Findings: No erythema or rash.     Comments: Solar lentigines diffusely   Neurological:     Mental Status: She is alert. Mental status is at baseline.     Cranial Nerves: No cranial nerve deficit.     Motor: No abnormal muscle tone.     Coordination: Coordination normal.     Gait: Gait normal.     Deep Tendon Reflexes: Reflexes are normal and symmetric.   Psychiatric:        Mood and Affect: Mood normal.        Cognition and Memory: Cognition and memory normal.           Assessment & Plan:   Problem List Items Addressed This Visit      Cardiovascular and Mediastinum   Hypertension, essential    bp in fair control at this time  BP Readings from Last 1 Encounters:  04/10/19 122/80   No changes needed Most recent labs reviewed  Disc lifstyle change with low sodium diet and exercise        Relevant Medications   lisinopril (ZESTRIL) 10 MG tablet     Musculoskeletal and Integument   Osteopenia    dexa 1/19  maaged by gyn Therapeutic vit D level  Good exercise and no falls/fx        Other   Family history of colon cancer (Chronic)    Ref for 5 y recall colonoscopy      Relevant Orders   Ambulatory referral to Gastroenterology   Vitamin D deficiency    Vitamin D level is therapeutic with current supplementation Disc importance of this to bone and overall health Level of 65.5      Routine general medical examination at a health care facility - Primary    Reviewed health habits including diet and exercise and skin cancer prevention Reviewed appropriate screening tests for age  Also reviewed health mt list, fam hx and immunization status , as well as social and family history   See HPI Labs reviewed She will f/u with gyn at [facility] for her mammogram and routine exam Colonoscopy ref done  dexa reviewed  Flu vaccine given       Colon cancer screening    Due for 5 y recall colonoscopy (fam hx of colon cancer) Referral done      Elevated random blood glucose level    Lab Results  Component Value Date   HGBA1C 5.6 04/07/2019   disc imp of low glycemic diet and wt loss to prevent DM2        Other Visit Diagnoses    Need for influenza vaccination       Relevant Orders   Flu Vaccine QUAD 6+ mos PF IM (Fluarix Quad PF) (Completed)

## 2019-04-12 NOTE — Assessment & Plan Note (Signed)
Reviewed health habits including diet and exercise and skin cancer prevention Reviewed appropriate screening tests for age  Also reviewed health mt list, fam hx and immunization status , as well as social and family history   See HPI Labs reviewed She will f/u with gyn at [facility] for her mammogram and routine exam Colonoscopy ref done  dexa reviewed  Flu vaccine given

## 2019-04-12 NOTE — Assessment & Plan Note (Signed)
dexa 1/19  maaged by gyn Therapeutic vit D level  Good exercise and no falls/fx

## 2019-04-12 NOTE — Assessment & Plan Note (Signed)
Vitamin D level is therapeutic with current supplementation Disc importance of this to bone and overall health Level of 65.5

## 2019-04-12 NOTE — Assessment & Plan Note (Signed)
Ref for 5y recall colonoscopy 

## 2019-04-12 NOTE — Assessment & Plan Note (Signed)
Lab Results  Component Value Date   HGBA1C 5.6 04/07/2019   disc imp of low glycemic diet and wt loss to prevent DM2

## 2019-04-12 NOTE — Assessment & Plan Note (Signed)
Due for 5 y recall colonoscopy (fam hx of colon cancer) Referral done

## 2019-04-12 NOTE — Assessment & Plan Note (Signed)
bp in fair control at this time  BP Readings from Last 1 Encounters:  04/10/19 122/80   No changes needed Most recent labs reviewed  Disc lifstyle change with low sodium diet and exercise

## 2019-04-13 ENCOUNTER — Telehealth: Payer: Self-pay

## 2019-04-13 NOTE — Telephone Encounter (Signed)
Thanks  Please let pt know what Dr Henrene Pastor said-sounds like the GI office will contact her

## 2019-04-13 NOTE — Telephone Encounter (Signed)
Vaughan Basta, Chart reviewed. Prior colons without polyps (here 2015 and [place] prior). Mother did have CRC reported, but at age > 70 (74 yrs), thus the 10 yr follow up recommendation based on guidelines. If she wants to proceed with colonoscopy at this time, given this knowledge, OK for direct colon in [place] . Thanks  Dr. Henrene Pastor

## 2019-04-13 NOTE — Telephone Encounter (Signed)
Pt had annual exam on 04/10/19; [facility] called pt today and said that Dr Henrene Pastor wanted pt to get colonoscopy in 10 yrs instead of 5 yrs. Pt said that her mom died of colon cancer and pt wants colonoscopy and has always had colonoscopy q83yrs. Pt was told would need to come in for consult with Dr Henrene Pastor to let him determine if she would get colonoscopy q5 yrs or 10. Pt does not want to do a consult appt and wants to know if there is anything Dr Glori Bickers can do to get pt a colonoscopy after 5 yrs. Pt request cb

## 2019-04-13 NOTE — Telephone Encounter (Signed)
I will forward this to Dr Henrene Pastor  Let her know we will get back to her

## 2019-04-14 NOTE — Telephone Encounter (Signed)
Please let pt know if she is interested in scheduling colonoscopy at this time to call the office and we will be happy to get her scheduled.

## 2019-04-16 ENCOUNTER — Encounter: Payer: Self-pay | Admitting: Family Medicine

## 2019-04-16 ENCOUNTER — Other Ambulatory Visit: Payer: Self-pay

## 2019-04-16 ENCOUNTER — Ambulatory Visit (INDEPENDENT_AMBULATORY_CARE_PROVIDER_SITE_OTHER): Payer: Managed Care, Other (non HMO) | Admitting: Family Medicine

## 2019-04-16 VITALS — BP 110/78 | HR 71 | Temp 98.2°F | Ht 67.0 in | Wt 178.5 lb

## 2019-04-16 DIAGNOSIS — M7551 Bursitis of right shoulder: Secondary | ICD-10-CM

## 2019-04-16 DIAGNOSIS — G5762 Lesion of plantar nerve, left lower limb: Secondary | ICD-10-CM

## 2019-04-16 DIAGNOSIS — M7581 Other shoulder lesions, right shoulder: Secondary | ICD-10-CM | POA: Diagnosis not present

## 2019-04-16 DIAGNOSIS — M19019 Primary osteoarthritis, unspecified shoulder: Secondary | ICD-10-CM

## 2019-04-16 NOTE — Progress Notes (Signed)
Laquanta Hummel T. Kani Chauvin, MD Primary Care and [place] at [facility] [place], [address] Phone: [phone]  FAX: [phone]  Deborah Orozco - 63 y.o. female  MRN [id number]  Date of Birth: Aug 05, 1955  Visit Date: 04/16/2019  PCP: Abner Greenspan, MD  Referred by: Tower, Wynelle Fanny, MD  Chief Complaint  Patient presents with  . Foot Pain    Left  . Shoulder Pain    Right   Subjective:   Deborah Orozco is a 63 y.o. very pleasant female patient with Body mass index is 27.96 kg/m. who presents with the following:  L foot pain and R shoulder pain.  L foot with pain under MT heads.  Walking all the time.  Fels like stepping on a knot.  She is having some pain and little bit of numbness primarily between the second and third metatarsal heads, and she is also having pain across the entirety of the metatarsal heads.  R shoulder.  Last December.  Saw a doctor in [place]. AC joint injection.  Constant dull ache and there is not strength.    MT bar in L foot.   Did an [facility] joint injection.  Now a dull ache and no str. She has pain with abduction as well as internal rotation.  This is on the right side only.  She is not clear if she had a distinct injury.  There is a dull ache and she thinks her strength might be somewhat decreased.  She is no had any prior shoulder fractures, dislocations or operative intervention.  Prior to this time period, she was fully functional.  bic r  mt pad   Past Medical History, Surgical History, Social History, Family History, Problem List, Medications, and Allergies have been reviewed and updated if relevant.  Patient Active Problem List   Diagnosis Date Noted  . Dizziness 12/14/2018  . Hypertension, essential 08/20/2018  . Dysthymia 03/03/2018  . Elevated random blood glucose level 02/28/2018  . Fatigue 04/01/2017  . Osteopenia 07/05/2015  . Family history of colon cancer 11/20/2013  .  Actinic keratosis 12/12/2012  . Colon cancer screening 07/09/2012  . Breast screening, unspecified 04/08/2012  . Routine general medical examination at a health care facility 12/20/2011  . Vitamin D deficiency 03/14/2010    Past Medical History:  Diagnosis Date  . Post-menopausal     Past Surgical History:  Procedure Laterality Date  . NO PAST SURGERIES      Social History   Socioeconomic History  . Marital status: Married    Spouse name: Not on file  . Number of children: Not on file  . Years of education: Not on file  . Highest education level: Not on file  Occupational History  . Not on file  Social Needs  . Financial resource strain: Not on file  . Food insecurity    Worry: Not on file    Inability: Not on file  . Transportation needs    Medical: Not on file    Non-medical: Not on file  Tobacco Use  . Smoking status: Never Smoker  . Smokeless tobacco: Never Used  Substance and Sexual Activity  . Alcohol use: Yes    Alcohol/week: 2.0 standard drinks    Types: 2 Glasses of wine per week    Comment: occ  . Drug use: No  . Sexual activity: Yes    Partners: Male    Birth control/protection: Post-menopausal  Lifestyle  .  Physical activity    Days per week: Not on file    Minutes per session: Not on file  . Stress: Not on file  Relationships  . Social Herbalist on phone: Not on file    Gets together: Not on file    Attends religious service: Not on file    Active member of club or organization: Not on file    Attends meetings of clubs or organizations: Not on file    Relationship status: Not on file  . Intimate partner violence    Fear of current or ex partner: Not on file    Emotionally abused: Not on file    Physically abused: Not on file    Forced sexual activity: Not on file  Other Topics Concern  . Not on file  Social History Narrative  . Not on file    Family History  Problem Relation Age of Onset  . Cancer Mother 59       Breast  twice recurred at 46/colon/liver  . Diabetes Mother   . Hypertension Mother   . Colon cancer Mother 66  . Hypertension Father   . Heart disease Brother   . Cancer Maternal Grandfather        possible colon cancer unsure of exact location gastro/tn    Allergies  Allergen Reactions  . Amlodipine     dizzy    Medication list reviewed and updated in full in [facility].  GEN: No fevers, chills. Nontoxic. Primarily MSK c/o today. MSK: Detailed in the HPI GI: tolerating PO intake without difficulty Neuro: No numbness, parasthesias, or tingling associated. Otherwise the pertinent positives of the ROS are noted above.   Objective:   BP 110/78   Pulse 71   Temp 98.2 F (36.8 C) (Temporal)   Ht 5\' 7"  (1.702 m)   Wt 178 lb 8 oz (81 kg)   SpO2 97%   BMI 27.96 kg/m    GEN: Well-developed,well-nourished,in no acute distress; alert,appropriate and cooperative throughout examination HEENT: Normocephalic and atraumatic without obvious abnormalities. Ears, externally no deformities PULM: Breathing comfortably in no respiratory distress EXT: No clubbing, cyanosis, or edema PSYCH: Normally interactive. Cooperative during the interview. Pleasant. Friendly and conversant. Not anxious or depressed appearing. Normal, full affect.  Shoulder: R Inspection: No muscle wasting or winging Ecchymosis/edema: neg  AC joint, scapula, clavicle: NT Cervical spine: NT, full ROM Spurling's: neg Abduction: full, 5/5 Flexion: full, 5/5 IR, full, lift-off: 5/5 ER at neutral: full, 5/5 AC crossover: + Neer: pos Hawkins: pos Drop Test: neg Empty Can: pos Supraspinatus insertion: mild-mod T Bicipital groove: NT Speed's: neg Yergason's: neg Sulcus sign: neg Scapular dyskinesis: none C5-T1 intact  Neuro: Sensation intact Grip 5/5   The entirety of the ankle is nontender.  The entirety of the midfoot is nontender.  She does have dropped metatarsal heads, and she has notable pain between  the second and third metatarsals, of their heads.  No pain with movement of the phalanges.  Radiology: No results found.  Assessment and Plan:     ICD-10-CM   1. Rotator cuff tendonitis, right  M75.81 Ambulatory referral to Physical Therapy  2. Subacromial bursitis of right shoulder joint  M75.51 Ambulatory referral to Physical Therapy  3. 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 Morton neuroma, left  G57.62    Classic rotator cuff tendinopathy with subacromial bursitis.  She certainly does have some AC arthropathy, but this is the not the primary pain  driver.  Morton's neuroma, classic.  I am in a place a metatarsal bar.  On follow-up if she is still having some symptoms then we can easily do a Morton's neuroma injection.  Follow-up: Return in about 6 weeks (around 05/28/2019).  No orders of the defined types were placed in this encounter.  Orders Placed This Encounter  Procedures  . Ambulatory referral to Physical Therapy    Signed,  Frederico Hamman T. Pascha Fogal, MD   Outpatient Encounter Medications as of 04/16/2019  Medication Sig  . Cholecalciferol (VITAMIN D-3) 5000 units TABS Take 1 capsule by mouth daily.  . fluticasone (FLONASE) 50 MCG/ACT nasal spray Place into both nostrils daily as needed for allergies or rhinitis.  Marland Kitchen lisinopril (ZESTRIL) 10 MG tablet Take 1 tablet (10 mg total) by mouth daily.  . Multiple Vitamin (MULTIVITAMIN) capsule Take by mouth. HERBALIFE  . Probiotic Product (PROBIOTIC PO) Take 1 capsule by mouth every morning.   No facility-administered encounter medications on file as of 04/16/2019.

## 2019-05-13 ENCOUNTER — Encounter: Payer: Self-pay | Admitting: Internal Medicine

## 2019-05-26 ENCOUNTER — Ambulatory Visit: Payer: Managed Care, Other (non HMO) | Admitting: Internal Medicine

## 2019-05-27 ENCOUNTER — Ambulatory Visit: Payer: Managed Care, Other (non HMO) | Admitting: Family Medicine

## 2019-06-04 DIAGNOSIS — H5213 Myopia, bilateral: Secondary | ICD-10-CM | POA: Insufficient documentation

## 2019-06-25 ENCOUNTER — Encounter: Payer: Managed Care, Other (non HMO) | Admitting: Internal Medicine

## 2019-08-30 ENCOUNTER — Ambulatory Visit: Payer: Managed Care, Other (non HMO) | Attending: Internal Medicine

## 2019-08-30 DIAGNOSIS — Z23 Encounter for immunization: Secondary | ICD-10-CM | POA: Insufficient documentation

## 2019-08-30 NOTE — Progress Notes (Signed)
   Covid-19 Vaccination Clinic  Name:  Deborah Orozco    MRN: [id number] DOB: 1955-10-04  08/30/2019  Ms. Mickiewicz was observed post Covid-19 immunization for 15 minutes without incidence. She was provided with Vaccine Information Sheet and instruction to access the V-Safe system.   Ms. Eldred was instructed to call 911 with any severe reactions post vaccine: Marland Kitchen Difficulty breathing  . Swelling of your face and throat  . A fast heartbeat  . A bad rash all over your body  . Dizziness and weakness    Immunizations Administered    Name Date Dose VIS Date Route   Pfizer COVID-19 Vaccine 08/30/2019 10:26 AM 0.3 mL 07/03/2019 Intramuscular   Manufacturer: [place]   Lot: [id number]   [place]: [id number]

## 2019-09-23 ENCOUNTER — Ambulatory Visit: Payer: Managed Care, Other (non HMO) | Attending: Internal Medicine

## 2019-09-23 ENCOUNTER — Ambulatory Visit: Payer: Managed Care, Other (non HMO)

## 2019-09-23 DIAGNOSIS — Z23 Encounter for immunization: Secondary | ICD-10-CM

## 2019-09-23 NOTE — Progress Notes (Signed)
   Covid-19 Vaccination Clinic  Name:  Deborah Orozco    MRN: [id number] DOB: [birth year]  09/23/2019  [name] was observed post Covid-19 immunization for 15 minutes without incident. She was provided with Vaccine Information Sheet and instruction to access the V-Safe system.   [name] was instructed to call 911 with any severe reactions post vaccine: Marland Kitchen Difficulty breathing  . Swelling of face and throat  . A fast heartbeat  . A bad rash all over body  . Dizziness and weakness   Immunizations Administered    Name Date Dose VIS Date Route   Pfizer COVID-19 Vaccine 09/23/2019  8:23 AM 0.3 mL 07/03/2019 Intramuscular   Manufacturer: [place]   Lot: [id number]   [place]: [id number]

## 2019-11-20 ENCOUNTER — Encounter: Payer: Self-pay | Admitting: Internal Medicine

## 2019-12-30 ENCOUNTER — Other Ambulatory Visit (HOSPITAL_COMMUNITY): Payer: Self-pay | Admitting: Radiology

## 2019-12-30 DIAGNOSIS — R222 Localized swelling, mass and lump, trunk: Secondary | ICD-10-CM

## 2020-01-27 ENCOUNTER — Telehealth: Payer: Self-pay | Admitting: *Deleted

## 2020-01-27 NOTE — Telephone Encounter (Signed)
Should be 10 years from previous date.  Not sure why she was sent previsit letter last fall.  You might want to check with patient to see if there is any clinical or historical changes.  The reason she is 10 years instead of 5, with family history, is her mother's advanced age at diagnosis.  If she is uncomfortable and wishes to proceed sooner (at this time) then that is not unreasonable and would be okay.  Thanks

## 2020-01-27 NOTE — Telephone Encounter (Signed)
Dr. Henrene Pastor, This pt is scheduled for a repeat colonoscopy on 02-16-20.  While getting her chart ready for [facility], I noted she had a procedure with you in 2015.  She does have a [place] in her mother- age 62 per her report.  You recommended a 10 year follow up.  Just making sure I am not missing anything- I do not believe she due at this time.  Thanks, [employer]

## 2020-01-27 NOTE — Telephone Encounter (Signed)
Spoke with pt- she feels more comfortable proceeding with procedure at this time d/t "how quickly it came upon my mother."  She is not having any symptoms.  Will proceed as scheduled.

## 2020-02-02 ENCOUNTER — Other Ambulatory Visit: Payer: Self-pay

## 2020-02-02 ENCOUNTER — Ambulatory Visit (AMBULATORY_SURGERY_CENTER): Payer: Self-pay

## 2020-02-02 VITALS — Ht 67.0 in | Wt 191.0 lb

## 2020-02-02 DIAGNOSIS — Z800 Family history of malignant neoplasm of digestive organs: Secondary | ICD-10-CM

## 2020-02-02 MED ORDER — SUTAB 1479-225-188 MG PO TABS: 12.0000 | ORAL_TABLET | ORAL | 0 refills | Status: DC

## 2020-02-02 NOTE — Progress Notes (Signed)
No egg or soy allergy known to patient  No issues with past sedation with any surgeries or procedures no intubation problems in the past  No diet pills per patient No home 02 use per patient  No blood thinners per patient  Pt denies issues with constipation  No A fib or A flutter  EMMI video to pt or MyChart  COVID 19 guidelines implemented in PV today   Pt has been vaccinated for covid.  Sutab code and  Coupon given to pt in PV today   Due to the COVID-19 pandemic we are asking patients to follow these guidelines. Please only bring one care partner. Please be aware that your care partner may wait in the car in the parking lot or if they feel like they will be too hot to wait in the car, they may wait in the lobby on the 4th floor. All care partners are required to wear a mask the entire time (we do not have any that we can provide them), they need to practice social distancing, and we will do a Covid check for all patient's and care partners when you arrive. Also we will check their temperature and your temperature. If the care partner waits in their car they need to stay in the parking lot the entire time and we will call them on their cell phone when the patient is ready for discharge so they can bring the car to the front of the building. Also all patient's will need to wear a mask into building.  

## 2020-02-03 ENCOUNTER — Encounter: Payer: Self-pay | Admitting: Internal Medicine

## 2020-02-04 ENCOUNTER — Other Ambulatory Visit: Payer: Self-pay | Admitting: Radiology

## 2020-02-04 DIAGNOSIS — Z803 Family history of malignant neoplasm of breast: Secondary | ICD-10-CM

## 2020-02-08 ENCOUNTER — Other Ambulatory Visit: Payer: Self-pay | Admitting: Radiology

## 2020-02-08 ENCOUNTER — Ambulatory Visit
Admission: RE | Admit: 2020-02-08 | Discharge: 2020-02-08 | Disposition: A | Discharge status: Home / Self Care | Payer: Managed Care, Other (non HMO) | Source: Ambulatory Visit | Attending: Radiology | Admitting: Radiology

## 2020-02-08 DIAGNOSIS — R222 Localized swelling, mass and lump, trunk: Secondary | ICD-10-CM

## 2020-02-08 IMAGING — DX DG LUMBAR SPINE 2-3V
3 series · 3 of 3 positions shown · non-contrast
Comparison: None.

CLINICAL DATA: Mass/lump on trunk for 1 month

EXAM:
LUMBAR SPINE - 2-3 VIEW

[dg lumbar spine 2-3 views (1 of 3)]
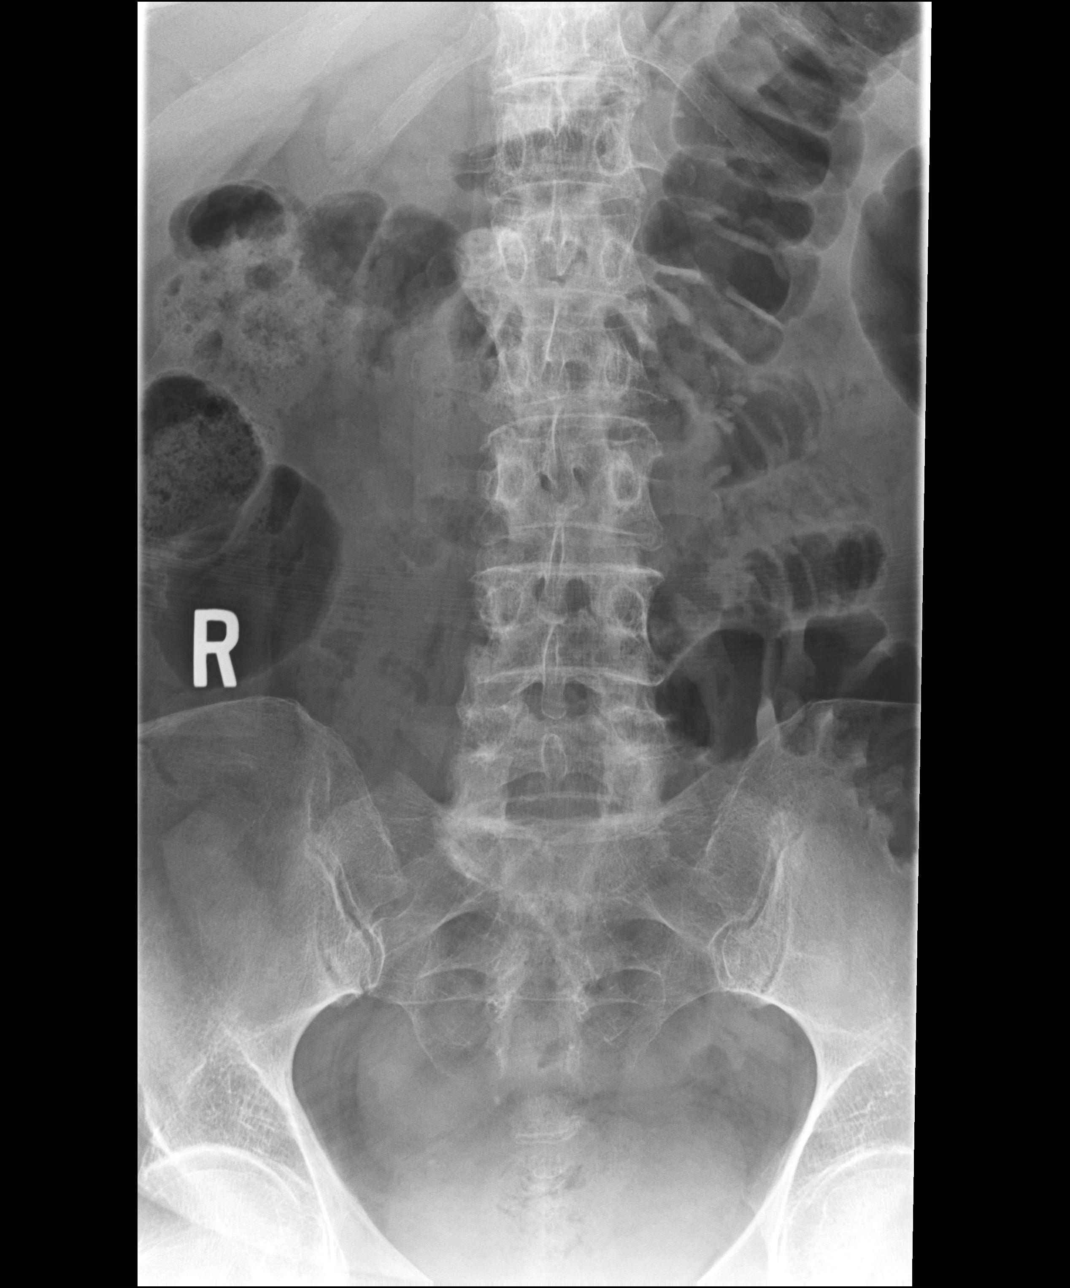

[dg lumbar spine 2-3 views (2 of 3)]
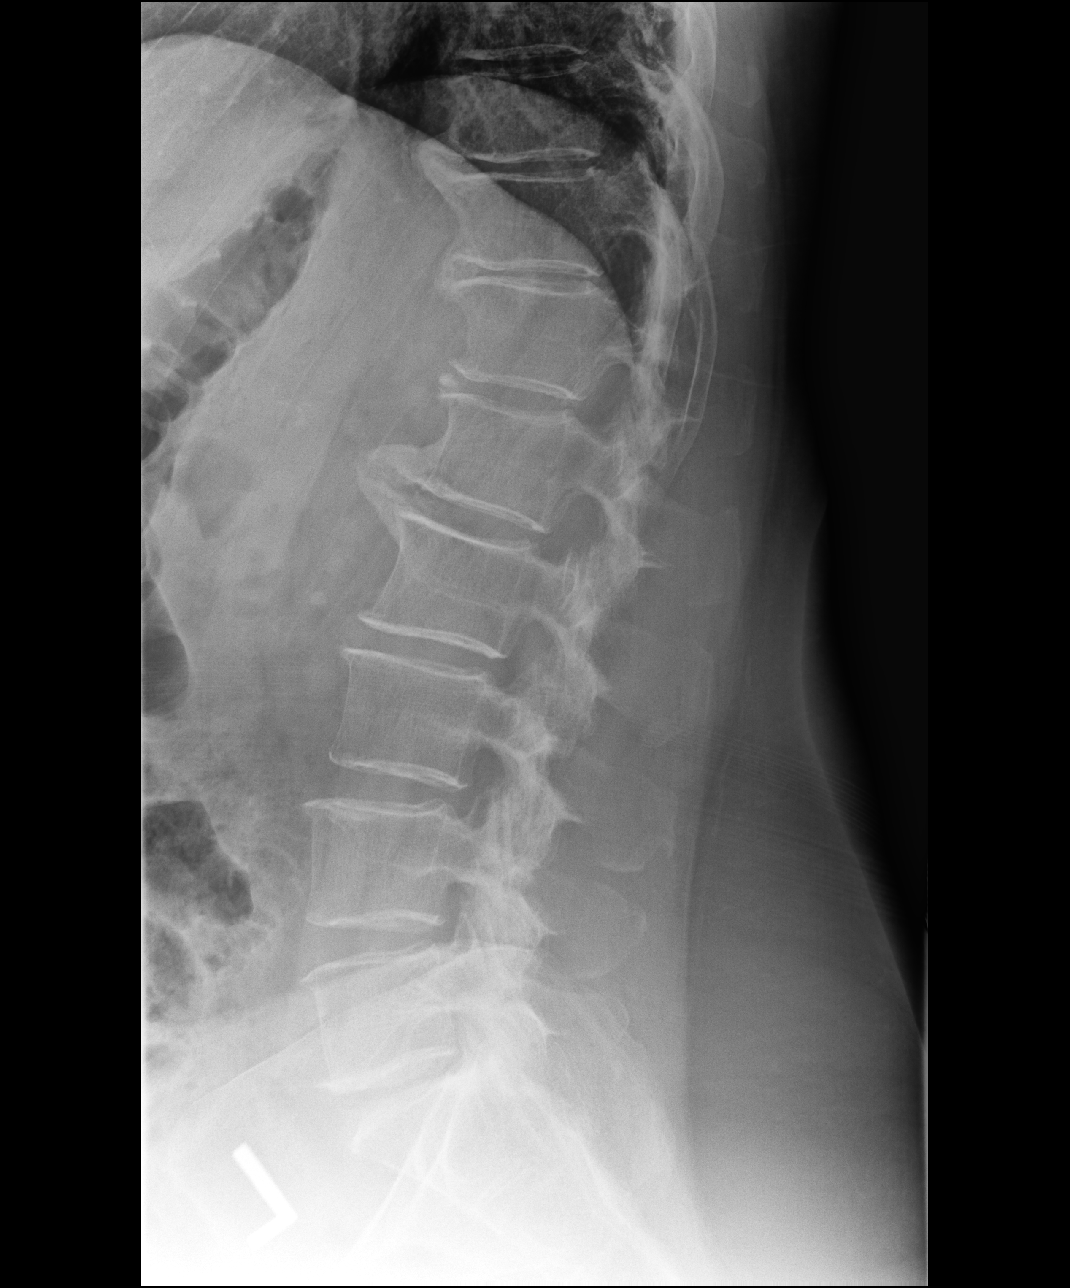

[dg lumbar spine 2-3 views (3 of 3)]
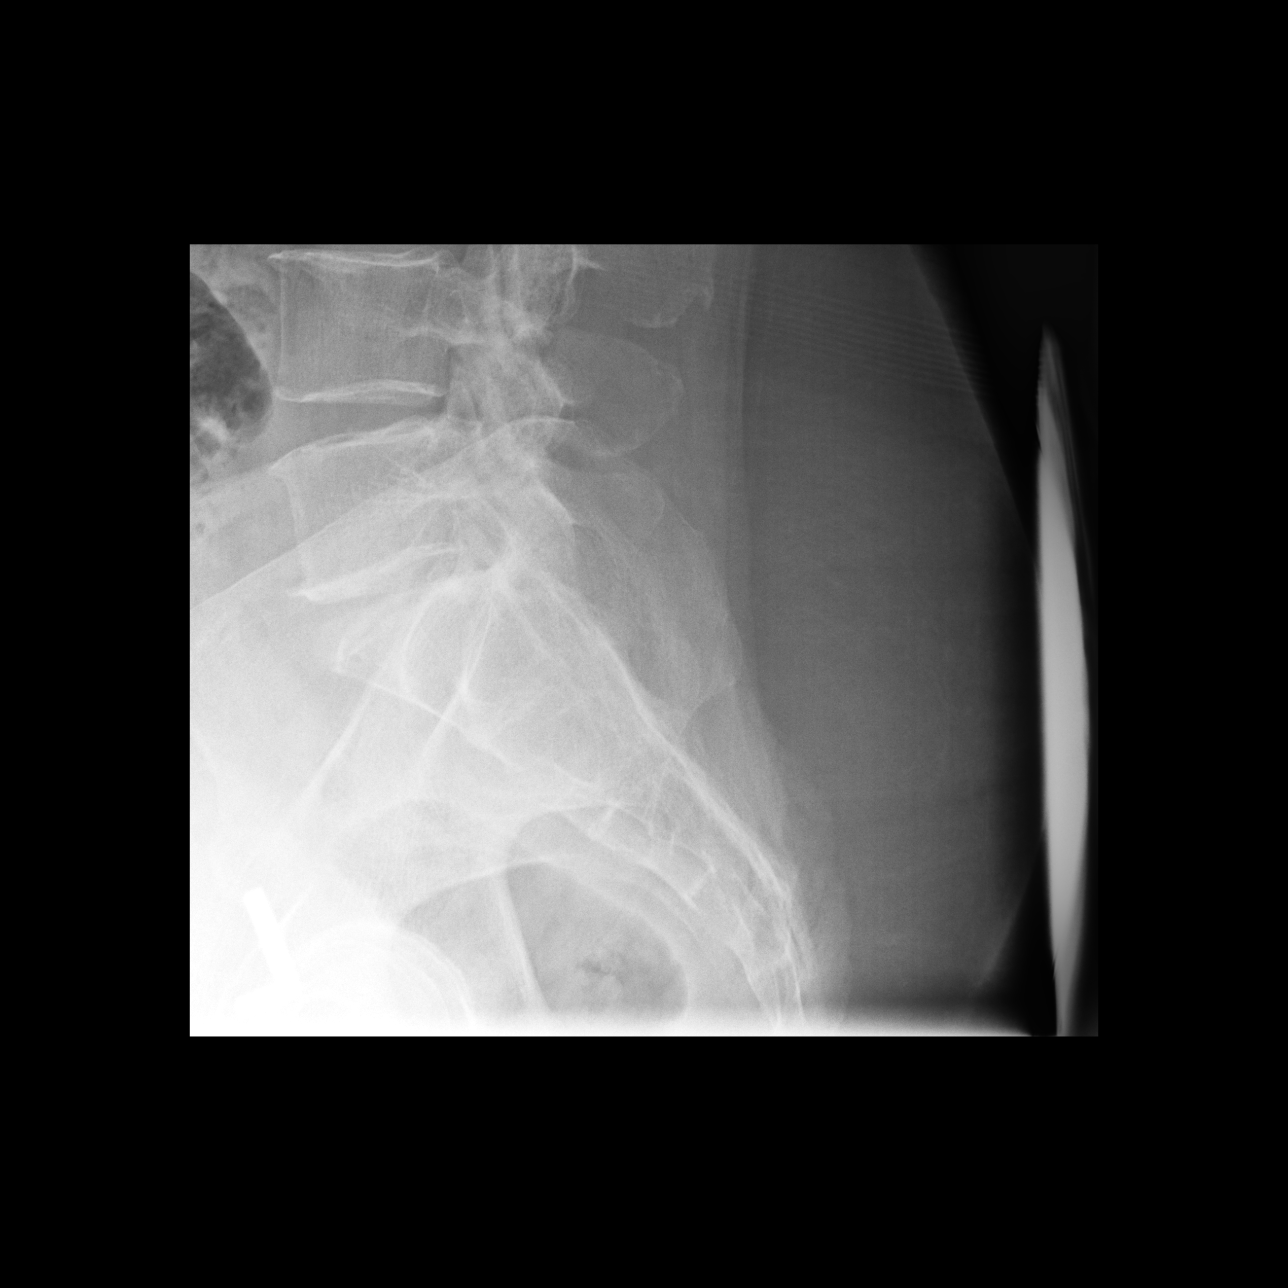

[3 of 3 positions shown; findings below may reference images not displayed]

FINDINGS: Transitional anatomy with 6 non rib-bearing lumbar type vertebra.
For the purposes of reporting, the last well-formed vertebra will be
designated L5. Lumbar alignment within normal limits. Vertebral body
heights are maintained. Mild degenerative changes throughout with
bulky anterior and right lateral osteophyte at L1-L2.
IMPRESSION: Mild degenerative changes. No acute osseous abnormality.

## 2020-02-16 ENCOUNTER — Encounter: Payer: Managed Care, Other (non HMO) | Admitting: Internal Medicine

## 2020-03-31 ENCOUNTER — Encounter: Payer: Managed Care, Other (non HMO) | Admitting: Internal Medicine

## 2020-05-01 ENCOUNTER — Other Ambulatory Visit: Payer: Self-pay | Admitting: Family Medicine

## 2020-05-02 NOTE — Telephone Encounter (Signed)
Please schedule PE and refill until then  

## 2020-05-02 NOTE — Telephone Encounter (Signed)
Pt hasn't been seen in over a year and no future appts., please advise  

## 2020-05-03 NOTE — Telephone Encounter (Signed)
Med refilled once  

## 2020-05-03 NOTE — Telephone Encounter (Signed)
Patient called in and scheduled for cpe. Please advise for medication refill.

## 2020-05-12 ENCOUNTER — Telehealth: Payer: Self-pay | Admitting: Family Medicine

## 2020-05-12 DIAGNOSIS — R7309 Other abnormal glucose: Secondary | ICD-10-CM

## 2020-05-12 DIAGNOSIS — I10 Essential (primary) hypertension: Secondary | ICD-10-CM

## 2020-05-12 DIAGNOSIS — E559 Vitamin D deficiency, unspecified: Secondary | ICD-10-CM

## 2020-05-12 NOTE — Telephone Encounter (Signed)
-----   Message from Deborah Orozco sent at 05/03/2020  2:47 PM EDT ----- Regarding: Lab orders for Friday, 10.22.21 Patient is scheduled for CPX labs, please order future labs, Thanks , Karna Christmas

## 2020-05-13 ENCOUNTER — Other Ambulatory Visit: Payer: Managed Care, Other (non HMO)

## 2020-05-20 ENCOUNTER — Encounter: Payer: Managed Care, Other (non HMO) | Admitting: Family Medicine

## 2020-05-23 ENCOUNTER — Ambulatory Visit (AMBULATORY_SURGERY_CENTER): Payer: Managed Care, Other (non HMO) | Admitting: Internal Medicine

## 2020-05-23 ENCOUNTER — Other Ambulatory Visit: Payer: Self-pay

## 2020-05-23 ENCOUNTER — Encounter: Payer: Self-pay | Admitting: Internal Medicine

## 2020-05-23 VITALS — BP 128/75 | HR 54 | Temp 98.0°F | Resp 16 | Ht 67.0 in | Wt 191.0 lb

## 2020-05-23 DIAGNOSIS — Z1211 Encounter for screening for malignant neoplasm of colon: Secondary | ICD-10-CM | POA: Diagnosis not present

## 2020-05-23 DIAGNOSIS — K635 Polyp of colon: Secondary | ICD-10-CM

## 2020-05-23 DIAGNOSIS — Z800 Family history of malignant neoplasm of digestive organs: Secondary | ICD-10-CM | POA: Diagnosis not present

## 2020-05-23 DIAGNOSIS — D122 Benign neoplasm of ascending colon: Secondary | ICD-10-CM

## 2020-05-23 MED ORDER — SODIUM CHLORIDE 0.9 % IV SOLN: 500.0000 mL | Freq: Once | INTRAVENOUS | Status: DC

## 2020-05-23 NOTE — Progress Notes (Signed)
Vs in adm by Delos Haring RN  Pt's states no medical or surgical changes since previsit or office visit.

## 2020-05-23 NOTE — Patient Instructions (Signed)
Handouts given:  Polyps, Diverticulosis, Hemorrhoids Resume previous diet  continue current medications Await pathology results  YOU HAD AN ENDOSCOPIC PROCEDURE TODAY AT [facility]:   Refer to the procedure report that was given to you for any specific questions about what was found during the examination.  If the procedure report does not answer your questions, please call your gastroenterologist to clarify.  If you requested that your care partner not be given the details of your procedure findings, then the procedure report has been included in a sealed envelope for you to review at your convenience later.  YOU SHOULD EXPECT: Some feelings of bloating in the abdomen. Passage of more gas than usual.  Walking can help get rid of the air that was put into your GI tract during the procedure and reduce the bloating. If you had a lower endoscopy (such as a colonoscopy or flexible sigmoidoscopy) you may notice spotting of blood in your stool or on the toilet paper. If you underwent a bowel prep for your procedure, you may not have a normal bowel movement for a few days.  Please Note:  You might notice some irritation and congestion in your nose or some drainage.  This is from the oxygen used during your procedure.  There is no need for concern and it should clear up in a day or so.  SYMPTOMS TO REPORT IMMEDIATELY:   Following lower endoscopy (colonoscopy or flexible sigmoidoscopy):  Excessive amounts of blood in the stool  Significant tenderness or worsening of abdominal pains  Swelling of the abdomen that is new, acute  Fever of 100F or higher   Following upper endoscopy (EGD)  Vomiting of blood or coffee ground material  New chest pain or pain under the shoulder blades  Painful or persistently difficult swallowing  New shortness of breath  Fever of 100F or higher  Black, tarry-looking stools  For urgent or emergent issues, a gastroenterologist can be reached at any hour  by calling [phone]. Do not use MyChart messaging for urgent concerns.   DIET:  We do recommend a small meal at first, but then you may proceed to your regular diet.  Drink plenty of fluids but you should avoid alcoholic beverages for 24 hours.  ACTIVITY:  You should plan to take it easy for the rest of today and you should NOT DRIVE or use heavy machinery until tomorrow (because of the sedation medicines used during the test).    FOLLOW UP: Our staff will call the number listed on your records 48-72 hours following your procedure to check on you and address any questions or concerns that you may have regarding the information given to you following your procedure. If we do not reach you, we will leave a message.  We will attempt to reach you two times.  During this call, we will ask if you have developed any symptoms of COVID 19. If you develop any symptoms (ie: fever, flu-like symptoms, shortness of breath, cough etc.) before then, please call [phone].  If you test positive for Covid 19 in the 2 weeks post procedure, please call and report this information to [place].    If any biopsies were taken you will be contacted by phone or by letter within the next 1-3 weeks.  Please call us at [phone] if you have not heard about the biopsies in 3 weeks.    SIGNATURES/CONFIDENTIALITY: You and/or your care partner have signed paperwork which will be entered into your  electronic medical record.  These signatures attest to the fact that that the information above on your After Visit Summary has been reviewed and is understood.  Full responsibility of the confidentiality of this discharge information lies with you and/or your care-partner.

## 2020-05-23 NOTE — Progress Notes (Signed)
pt tolerated well. VSS. awake and to recovery. Report given to RN.  

## 2020-05-23 NOTE — Progress Notes (Signed)
Called to room to assist during endoscopic procedure.  Patient ID and intended procedure confirmed with present staff. Received instructions for my participation in the procedure from the performing physician.  

## 2020-05-23 NOTE — Op Note (Signed)
[facility] Patient Name: Deborah Orozco Procedure Date: 05/23/2020 10:19 AM MRN: [id number] Endoscopist: Docia Chuck. Henrene Pastor MD, MD Age: 64 Referring MD:  Date of Birth: Jun 06, 1956 Gender: Female Account #: [id number] Procedure:                Colonoscopy with cold snare polypectomy x 1 Indications:              Screening in patient at increased risk: Colorectal                            cancer in mother 62 or older. Previous examinations                            2009 ([place]) and in 2015 were negative for                            neoplasia Medicines:                Monitored Anesthesia Care Procedure:                Pre-Anesthesia Assessment:                           - Prior to the procedure, a History and Physical                            was performed, and patient medications and                            allergies were reviewed. The patient's tolerance of                            previous anesthesia was also reviewed. The risks                            and benefits of the procedure and the sedation                            options and risks were discussed with the patient.                            All questions were answered, and informed consent                            was obtained. Prior Anticoagulants: The patient has                            taken no previous anticoagulant or antiplatelet                            agents. ASA Grade Assessment: II - A patient with                            mild systemic disease. After reviewing the risks  and benefits, the patient was deemed in                            satisfactory condition to undergo the procedure.                           After obtaining informed consent, the colonoscope                            was passed under direct vision. Throughout the                            procedure, the patient's blood pressure, pulse, and                            oxygen  saturations were monitored continuously. The                            Colonoscope was introduced through the anus and                            advanced to the the cecum, identified by                            appendiceal orifice and ileocecal valve. The                            ileocecal valve, appendiceal orifice, and rectum                            were photographed. The quality of the bowel                            preparation was excellent. The colonoscopy was                            performed without difficulty. The patient tolerated                            the procedure well. The bowel preparation used was                            SUPREP via split dose instruction. Scope In: 10:28:08 AM Scope Out: 10:42:21 AM Scope Withdrawal Time: 0 hours 11 minutes 57 seconds  Total Procedure Duration: 0 hours 14 minutes 13 seconds  Findings:                 A 6 mm polyp was found in the proximal ascending                            colon. The polyp was sessile. The polyp was removed                            with a cold snare. Resection and retrieval  were                            complete.                           Many diverticula were found in the entire colon.                           Internal hemorrhoids were found during                            retroflexion. The hemorrhoids were moderate.                           The exam was otherwise without abnormality on                            direct and retroflexion views. Complications:            No immediate complications. Estimated blood loss:                            None. Estimated Blood Loss:     Estimated blood loss: none. Impression:               - One 6 mm polyp in the proximal ascending colon,                            removed with a cold snare. Resected and retrieved.                           - Diverticulosis in the entire examined colon.                           - Internal hemorrhoids.                            - The examination was otherwise normal on direct                            and retroflexion views. Recommendation:           - Repeat colonoscopy in 5 years for surveillance.                           - Patient has a contact number available for                            emergencies. The signs and symptoms of potential                            delayed complications were discussed with the                            patient. Return to normal activities tomorrow.  Written discharge instructions were provided to the                            patient.                           - Resume previous diet.                           - Continue present medications.                           - Await pathology results. Docia Chuck. Henrene Pastor MD, MD 05/23/2020 10:47:46 AM This report has been signed electronically.

## 2020-05-25 ENCOUNTER — Telehealth: Payer: Self-pay

## 2020-05-25 NOTE — Telephone Encounter (Signed)
  Follow up Call-  Call back number 05/23/2020  Post procedure Call Back phone  # [phone]  Permission to leave phone message Yes  Some recent data might be hidden     Patient questions:  Do you have a fever, pain , or abdominal swelling? No. Pain Score  0 *  Have you tolerated food without any problems? Yes.    Have you been able to return to your normal activities? Yes.    Do you have any questions about your discharge instructions: Diet   No. Medications  No. Follow up visit  No.  Do you have questions or concerns about your Care? No.  Actions: * If pain score is 4 or above: No action needed, pain <4.   1. Have you developed a fever since your procedure? no  2.   Have you had an respiratory symptoms (SOB or cough) since your procedure? no  3.   Have you tested positive for COVID 19 since your procedure no  4.   Have you had any family members/close contacts diagnosed with the COVID 19 since your procedure?  no   If yes to any of these questions please route to Joylene John, RN and Joella Prince, RN

## 2020-05-27 ENCOUNTER — Encounter: Payer: Self-pay | Admitting: Internal Medicine

## 2020-06-03 ENCOUNTER — Other Ambulatory Visit: Payer: Managed Care, Other (non HMO)

## 2020-06-10 ENCOUNTER — Encounter: Payer: Managed Care, Other (non HMO) | Admitting: Family Medicine

## 2020-06-23 ENCOUNTER — Other Ambulatory Visit (INDEPENDENT_AMBULATORY_CARE_PROVIDER_SITE_OTHER): Payer: Managed Care, Other (non HMO)

## 2020-06-23 ENCOUNTER — Other Ambulatory Visit: Payer: Self-pay

## 2020-06-23 DIAGNOSIS — I10 Essential (primary) hypertension: Secondary | ICD-10-CM | POA: Diagnosis not present

## 2020-06-23 DIAGNOSIS — E559 Vitamin D deficiency, unspecified: Secondary | ICD-10-CM

## 2020-06-23 DIAGNOSIS — R7309 Other abnormal glucose: Secondary | ICD-10-CM

## 2020-06-23 LAB — COMPREHENSIVE METABOLIC PANEL
ALT: 14 U/L (ref 0–35)
AST: 13 U/L (ref 0–37)
Albumin: 4.2 g/dL (ref 3.5–5.2)
Alkaline Phosphatase: 80 U/L (ref 39–117)
BUN: 20 mg/dL (ref 6–23)
CO2: 27 mEq/L (ref 19–32)
Calcium: 9.8 mg/dL (ref 8.4–10.5)
Chloride: 107 mEq/L (ref 96–112)
Creatinine, Ser: 0.75 mg/dL (ref 0.40–1.20)
GFR: 85.3 mL/min (ref >60.00)
Glucose, Bld: 96 mg/dL (ref 70–99)
Potassium: 4.3 mEq/L (ref 3.5–5.1)
Sodium: 142 mEq/L (ref 135–145)
Total Bilirubin: 0.7 mg/dL (ref 0.2–1.2)
Total Protein: 6.1 g/dL (ref 6.0–8.3)

## 2020-06-23 LAB — CBC WITH DIFFERENTIAL/PLATELET
Basophils Absolute: 0.1 10*3/uL (ref 0.0–0.1)
Basophils Relative: 1.1 % (ref 0.0–3.0)
Eosinophils Absolute: 0.4 10*3/uL (ref 0.0–0.7)
Eosinophils Relative: 6.8 % — ABNORMAL HIGH (ref 0.0–5.0)
HCT: 40.1 % (ref 36.0–46.0)
Hemoglobin: 13.6 g/dL (ref 12.0–15.0)
Lymphocytes Relative: 25.4 % (ref 12.0–46.0)
Lymphs Abs: 1.6 10*3/uL (ref 0.7–4.0)
MCHC: 34 g/dL (ref 30.0–36.0)
MCV: 87.1 fl (ref 78.0–100.0)
Monocytes Absolute: 0.5 10*3/uL (ref 0.1–1.0)
Monocytes Relative: 8 % (ref 3.0–12.0)
Neutro Abs: 3.6 10*3/uL (ref 1.4–7.7)
Neutrophils Relative %: 58.7 % (ref 43.0–77.0)
Platelets: 236 10*3/uL (ref 150.0–400.0)
RBC: 4.6 Mil/uL (ref 3.87–5.11)
RDW: 12.8 % (ref 11.5–15.5)
WBC: 6.1 10*3/uL (ref 4.0–10.5)

## 2020-06-23 LAB — LIPID PANEL
Cholesterol: 193 mg/dL (ref 0–200)
HDL: 80.8 mg/dL (ref >39.00)
LDL Cholesterol: 96 mg/dL (ref 0–99)
NonHDL: 112.12
Total CHOL/HDL Ratio: 2
Triglycerides: 82 mg/dL (ref 0.0–149.0)
VLDL: 16.4 mg/dL (ref 0.0–40.0)

## 2020-06-23 LAB — TSH: TSH: 2.54 u[IU]/mL (ref 0.35–4.50)

## 2020-06-23 LAB — HEMOGLOBIN A1C: Hgb A1c MFr Bld: 5.1 % (ref 4.6–6.5)

## 2020-06-23 LAB — VITAMIN D 25 HYDROXY (VIT D DEFICIENCY, FRACTURES): VITD: 61.31 ng/mL (ref 30.00–100.00)

## 2020-06-27 ENCOUNTER — Encounter: Payer: Self-pay | Admitting: Family Medicine

## 2020-06-27 ENCOUNTER — Other Ambulatory Visit: Payer: Self-pay

## 2020-06-27 ENCOUNTER — Ambulatory Visit (INDEPENDENT_AMBULATORY_CARE_PROVIDER_SITE_OTHER): Payer: Managed Care, Other (non HMO) | Admitting: Family Medicine

## 2020-06-27 VITALS — BP 130/84 | HR 69 | Temp 96.9°F | Ht 67.0 in | Wt 188.0 lb

## 2020-06-27 DIAGNOSIS — M85859 Other specified disorders of bone density and structure, unspecified thigh: Secondary | ICD-10-CM

## 2020-06-27 DIAGNOSIS — Z23 Encounter for immunization: Secondary | ICD-10-CM

## 2020-06-27 DIAGNOSIS — I10 Essential (primary) hypertension: Secondary | ICD-10-CM

## 2020-06-27 DIAGNOSIS — R7309 Other abnormal glucose: Secondary | ICD-10-CM

## 2020-06-27 DIAGNOSIS — E559 Vitamin D deficiency, unspecified: Secondary | ICD-10-CM | POA: Diagnosis not present

## 2020-06-27 DIAGNOSIS — Z0000 Encounter for general adult medical examination without abnormal findings: Secondary | ICD-10-CM

## 2020-06-27 MED ORDER — LISINOPRIL 10 MG PO TABS
10.0000 mg | ORAL_TABLET | Freq: Every day | ORAL | 3 refills | Status: DC
Start: 2020-06-27 — End: 2021-07-25

## 2020-06-27 NOTE — Assessment & Plan Note (Signed)
Followed by gyn  Taking ca and D No falls or fractures  Good D level at 61  Disc need for calcium/ vitamin D/ wt bearing exercise and bone density test every 2 y to monitor Disc safety/ fracture risk in detail

## 2020-06-27 NOTE — Progress Notes (Signed)
Subjective:    Patient ID: Deborah Orozco, female    DOB: September 06, 1955, 64 y.o.   MRN: [id number]  This visit occurred during the SARS-CoV-2 public health emergency.  Safety protocols were in place, including screening questions prior to the visit, additional usage of staff PPE, and extensive cleaning of exam room while observing appropriate contact time as indicated for disinfecting solutions.    HPI Here for health maintenance exam and to review chronic medical problems    Wt Readings from Last 3 Encounters:  06/27/20 188 lb (85.3 kg)  05/23/20 191 lb (86.6 kg)  02/02/20 191 lb (86.6 kg)   29.44 kg/m  Taking care of herself  Recently tested for covid neg   Father stays with her for the winter (dementia)  She works from home    [place] 1/19, [facility]  Has f/u with MRI this week in light of family history Self breast exam-no lumps  Mother h/o breast cancer   Flu shot- today  covid status -immunized with booster pfizer  Tdap 8/19 Interested in shingrix   Pap 2/19 -nl at gyn   Colonoscopy 11/21 with 5 y recall (polyp)  Osteopenia  dexa 1/19  Managed by gyn  No falls  No fractures  Exercise -walking (has not been back to the gym)  D level good at 61  HTN bp is stable today  No cp or palpitations or headaches or edema  No side effects to medicines  BP Readings from Last 3 Encounters:  06/27/20 130/84  05/23/20 128/75  04/16/19 110/78     Takes lisinopril 10 mg daily  Lab Results  Component Value Date   CREATININE 0.75 06/23/2020   BUN 20 06/23/2020   NA 142 06/23/2020   K 4.3 06/23/2020   CL 107 06/23/2020   CO2 27 06/23/2020    Elevated glucose in the past Lab Results  Component Value Date   HGBA1C 5.1 06/23/2020   Cholesterol  Lab Results  Component Value Date   CHOL 193 06/23/2020   CHOL 158 04/07/2019   CHOL 181 02/25/2018   Lab Results  Component Value Date   HDL 80.80 06/23/2020   HDL 53.60 04/07/2019     HDL 74.60 02/25/2018   Lab Results  Component Value Date   LDLCALC 96 06/23/2020   LDLCALC 76 04/07/2019   LDLCALC 90 02/25/2018   Lab Results  Component Value Date   TRIG 82.0 06/23/2020   TRIG 141.0 04/07/2019   TRIG 81.0 02/25/2018   Lab Results  Component Value Date   CHOLHDL 2 06/23/2020   CHOLHDL 3 04/07/2019   CHOLHDL 2 02/25/2018   Lab Results  Component Value Date   LDLDIRECT 133.1 03/14/2010    Patient Active Problem List   Diagnosis Date Noted  . High myopia, both eyes 06/04/2019  . Dizziness 12/14/2018  . Hypertension, essential 08/20/2018  . Dysthymia 03/03/2018  . Elevated random blood glucose level 02/28/2018  . Fatigue 04/01/2017  . Osteopenia 07/05/2015  . Family history of colon cancer 11/20/2013  . Nuclear sclerotic cataract of both eyes 04/01/2013  . 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 Actinic keratosis 12/12/2012  . Colon cancer screening 07/09/2012  . Breast screening, unspecified 04/08/2012  . Routine general medical examination at a health care facility 12/20/2011  . Vitamin D deficiency 03/14/2010   Past Medical History:  Diagnosis Date  . Hypertension   . Post-menopausal   . Sleep apnea    use  cpap   Past Surgical History:  Procedure Laterality Date  . COLONOSCOPY  2015   Social History   Tobacco Use  . Smoking status: Never Smoker  . Smokeless tobacco: Never Used  Vaping Use  . Vaping Use: Never used  Substance Use Topics  . Alcohol use: Yes    Alcohol/week: 2.0 standard drinks    Types: 2 Glasses of wine per week    Comment: occ  . Drug use: No   Family History  Problem Relation Age of Onset  . Cancer Mother 24       Breast twice recurred at 46/colon/liver  . Diabetes Mother   . Hypertension Mother   . Colon cancer Mother 60  . Hypertension Father   . Heart disease Brother   . Colon polyps Brother   . Cancer Maternal Grandfather        possible colon cancer unsure of exact location gastro/tn  .  Stomach cancer Maternal Grandmother   . Esophageal cancer Neg Hx   . Rectal cancer Neg Hx    Allergies  Allergen Reactions  . Amlodipine     dizzy   Current Outpatient Medications on File Prior to Visit  Medication Sig Dispense Refill  . Calcium Carb-Cholecalciferol (CALCIUM 1000 + D) 1000-800 MG-UNIT TABS calcium    . Cholecalciferol (VITAMIN D-3) 5000 units TABS Take 1 capsule by mouth daily.    . fluticasone (FLONASE) 50 MCG/ACT nasal spray Place into both nostrils daily as needed for allergies or rhinitis.    . Multiple Vitamin (MULTIVITAMIN) capsule Take by mouth. HERBALIFE    . Probiotic Product (PROBIOTIC PO) Take 1 capsule by mouth every morning.     No current facility-administered medications on file prior to visit.    Review of Systems  Constitutional: Negative for activity change, appetite change, fatigue, fever and unexpected weight change.  HENT: Negative for congestion, ear pain, rhinorrhea, sinus pressure and sore throat.   Eyes: Negative for pain, redness and visual disturbance.  Respiratory: Negative for cough, shortness of breath and wheezing.   Cardiovascular: Negative for chest pain and palpitations.  Gastrointestinal: Negative for abdominal pain, blood in stool, constipation and diarrhea.  Endocrine: Negative for polydipsia and polyuria.  Genitourinary: Negative for dysuria, frequency and urgency.  Musculoskeletal: Negative for arthralgias, back pain and myalgias.  Skin: Positive for rash. Negative for pallor.       Rash in between breasts- occ itchy  Allergic/Immunologic: Negative for environmental allergies.  Neurological: Negative for dizziness, syncope and headaches.  Hematological: Negative for adenopathy. Does not bruise/bleed easily.  Psychiatric/Behavioral: Negative for decreased concentration and dysphoric mood. The patient is not nervous/anxious.        Objective:   Physical Exam Constitutional:      General: She is not in acute distress.     Appearance: Normal appearance. She is well-developed. She is not ill-appearing or diaphoretic.     Comments: Overweight   HENT:     Head: Normocephalic and atraumatic.     Right Ear: Tympanic membrane, ear canal and external ear normal.     Left Ear: Tympanic membrane, ear canal and external ear normal.     Nose: Nose normal. No congestion.     Mouth/Throat:     Mouth: Mucous membranes are moist.     Pharynx: Oropharynx is clear. No posterior oropharyngeal erythema.  Eyes:     General: No scleral icterus.    Extraocular Movements: Extraocular movements intact.     Conjunctiva/sclera: Conjunctivae normal.  Pupils: Pupils are equal, round, and reactive to light.  Neck:     Thyroid: No thyromegaly.     Vascular: No carotid bruit or JVD.  Cardiovascular:     Rate and Rhythm: Normal rate and regular rhythm.     Pulses: Normal pulses.     Heart sounds: Normal heart sounds. No gallop.   Pulmonary:     Effort: Pulmonary effort is normal. No respiratory distress.     Breath sounds: Normal breath sounds. No wheezing.     Comments: Good air exch Chest:     Chest wall: No tenderness.  Abdominal:     General: Bowel sounds are normal. There is no distension or abdominal bruit.     Palpations: Abdomen is soft. There is no mass.     Tenderness: There is no abdominal tenderness.     Hernia: No hernia is present.  Genitourinary:    Comments: Breast exam: No mass, nodules, thickening, tenderness, bulging, retraction, inflamation, nipple discharge or skin changes noted.  No axillary or clavicular LA.     Musculoskeletal:        General: No tenderness. Normal range of motion.     Cervical back: Normal range of motion and neck supple. No rigidity. No muscular tenderness.     Right lower leg: No edema.     Left lower leg: No edema.     Comments: No kyphosis   Lymphadenopathy:     Cervical: No cervical adenopathy.  Skin:    General: Skin is warm and dry.     Coloration: Skin is not pale.      Findings: No erythema or rash.     Comments: Some papules/erythema on chest/sternal area  Neurological:     Mental Status: She is alert. Mental status is at baseline.     Cranial Nerves: No cranial nerve deficit.     Motor: No abnormal muscle tone.     Coordination: Coordination normal.     Gait: Gait normal.     Deep Tendon Reflexes: Reflexes are normal and symmetric. Reflexes normal.  Psychiatric:        Mood and Affect: Mood normal.        Cognition and Memory: Cognition and memory normal.           Assessment & Plan:   Problem List Items Addressed This Visit      Cardiovascular and Mediastinum   Hypertension, essential   Relevant Medications   lisinopril (ZESTRIL) 10 MG tablet     Musculoskeletal and Integument   Osteopenia    Followed by gyn  Taking ca and D No falls or fractures  Good D level at 61  Disc need for calcium/ vitamin D/ wt bearing exercise and bone density test every 2 y to monitor Disc safety/ fracture risk in detail          Other   Vitamin D deficiency    D level of 6 Vitamin D level is therapeutic with current supplementation Disc importance of this to bone and overall health       Routine general medical examination at a health care facility - Primary    Reviewed health habits including diet and exercise and skin cancer prevention Reviewed appropriate screening tests for age  Also reviewed health mt list, fam hx and immunization status , as well as social and family history   See HPI Labs reviewed  covid immunized with booster Interested in shingrix -will check on coverage  Sent to  gyn for last mammogram report (planning MRI now)        Elevated random blood glucose level    Lab Results  Component Value Date   HGBA1C 5.1 06/23/2020   Good control disc imp of low glycemic diet and wt loss to prevent DM2        Other Visit Diagnoses    Need for influenza vaccination       Relevant Orders   Flu Vaccine QUAD 6+ mos PF IM  (Fluarix Quad PF) (Completed)

## 2020-06-27 NOTE — Assessment & Plan Note (Signed)
Lab Results  Component Value Date   HGBA1C 5.1 06/23/2020   Good control disc imp of low glycemic diet and wt loss to prevent DM2

## 2020-06-27 NOTE — Patient Instructions (Addendum)
If you are interested in the new shingles vaccine (Shingrix) - call your local pharmacy to check on coverage and availability  If affordable, get on a wait list at your pharmacy to get the vaccine.  Keep exercising  Get out doors when you can   Try otc cortisone cream on rash on chest   Flu shot today

## 2020-06-27 NOTE — Assessment & Plan Note (Signed)
D level of 6 Vitamin D level is therapeutic with current supplementation Disc importance of this to bone and overall health

## 2020-06-27 NOTE — Assessment & Plan Note (Signed)
Reviewed health habits including diet and exercise and skin cancer prevention Reviewed appropriate screening tests for age  Also reviewed health mt list, fam hx and immunization status , as well as social and family history   See HPI Labs reviewed  covid immunized with booster Interested in shingrix -will check on coverage  Sent to gyn for last mammogram report (planning MRI now)

## 2020-06-28 ENCOUNTER — Other Ambulatory Visit: Payer: Self-pay | Admitting: Radiology

## 2020-06-28 DIAGNOSIS — Z803 Family history of malignant neoplasm of breast: Secondary | ICD-10-CM

## 2020-06-30 ENCOUNTER — Other Ambulatory Visit: Payer: Self-pay

## 2020-06-30 ENCOUNTER — Other Ambulatory Visit: Payer: Managed Care, Other (non HMO)

## 2020-06-30 ENCOUNTER — Ambulatory Visit
Admission: RE | Admit: 2020-06-30 | Discharge: 2020-06-30 | Disposition: A | Discharge status: Home / Self Care | Payer: No Typology Code available for payment source | Source: Ambulatory Visit | Attending: Radiology | Admitting: Radiology

## 2020-06-30 DIAGNOSIS — Z803 Family history of malignant neoplasm of breast: Secondary | ICD-10-CM

## 2020-06-30 IMAGING — MR MR BREAST WO/W CM  BILAT
5 series · 30 of 48 positions shown · IV contrast (9 ml gadavist)
Comparison: No prior MRI available for comparison. Correlation made
with prior mammograms.

CLINICAL DATA: Abbreviated Breast MRI for breast cancer screening.
Family history of breast cancer in her mother at age 50 and 60.

LABS:  Creatinine of 0.75 mg/dL and GFR of 85.3 on [DATE]
EXAM:
BILATERAL ABBREVIATED BREAST MRI WITH AND WITHOUT CONTRAST
TECHNIQUE: Multiplanar, multisequence MR images of both breasts were obtained
prior to and following the intravenous administration of 9 ml of
Gadavist

[Series 2: t2_tirm_tra ipat (a-p) · axial · 3.0mm · 0.70mm/px · z∈[-62,+100]mm · 5 of 55 slices shown]
[im 1/55]
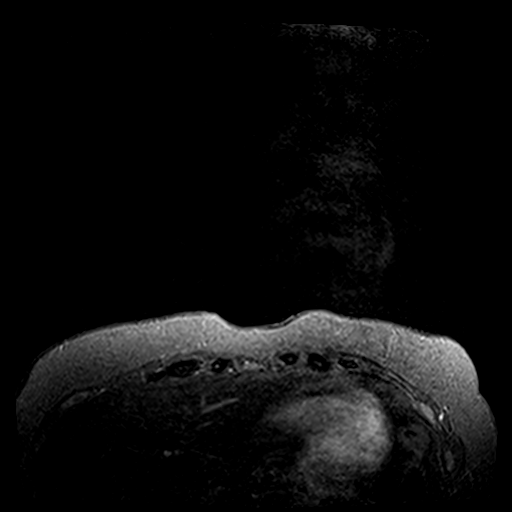
[im 14/55]
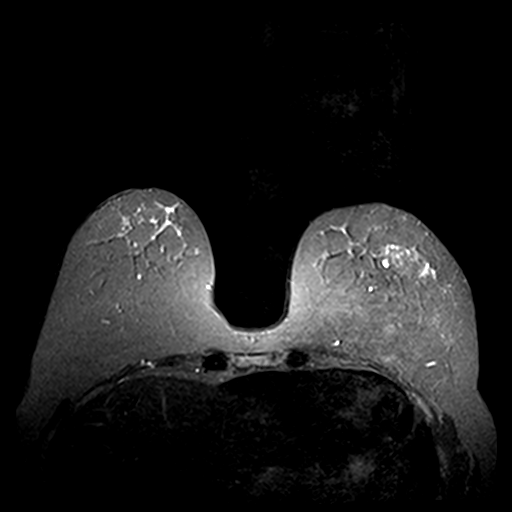
[im 28/55]
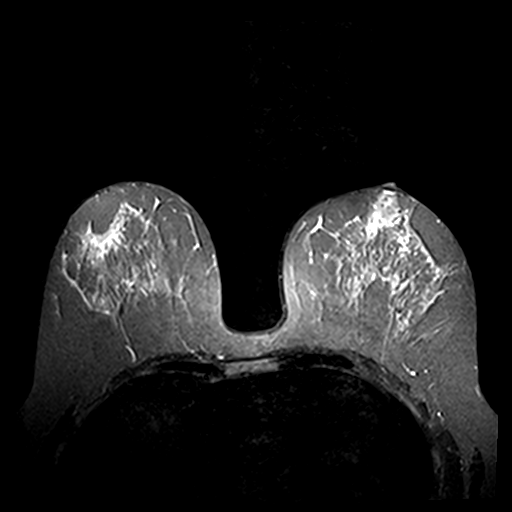
[im 41/55]
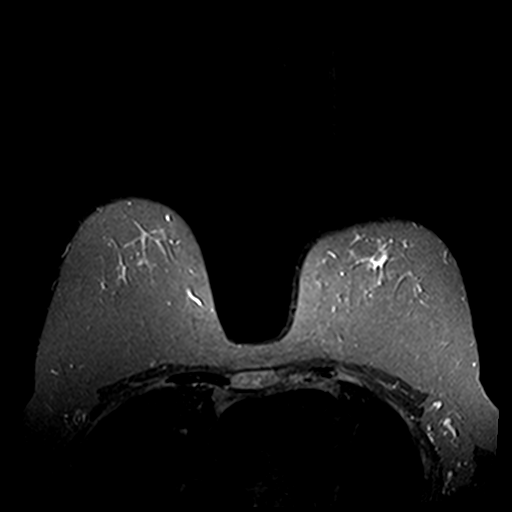
[im 55/55]
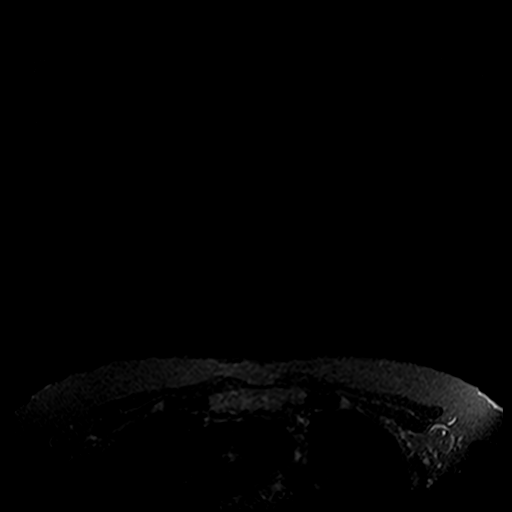

[Series 3: fl3d pre-cm · axial · non-contrast · 1.2mm · 0.94mm/px · z∈[-67,+105]mm · 8 of 144 slices shown]
[im 1/144]
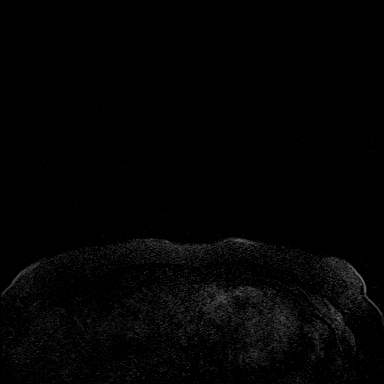
[im 23/144]
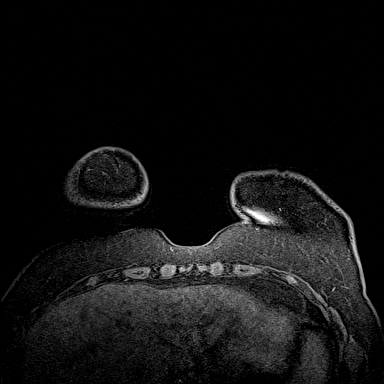
[im 45/144]
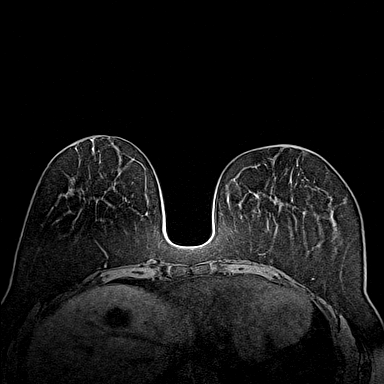
[im 67/144]
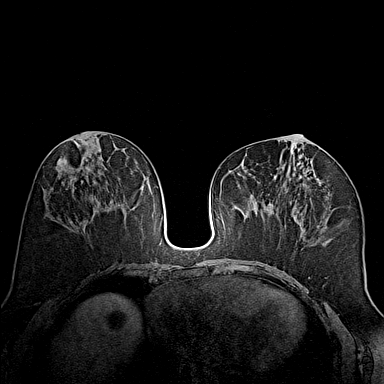
[im 78/144]
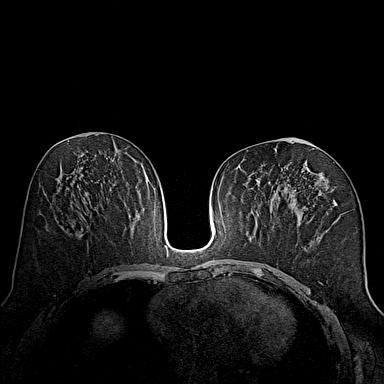
[im 100/144]
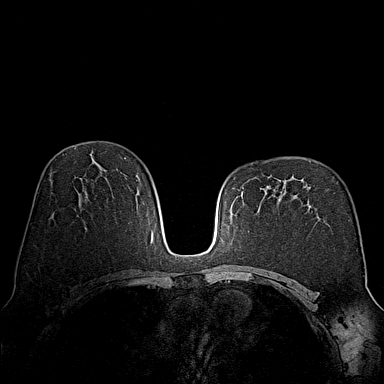
[im 122/144]
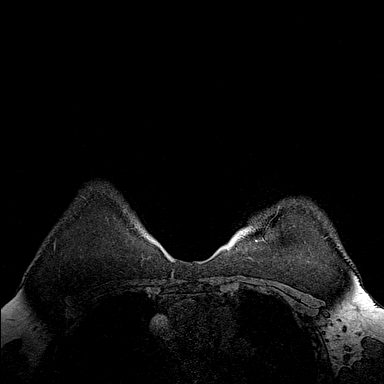
[im 144/144]
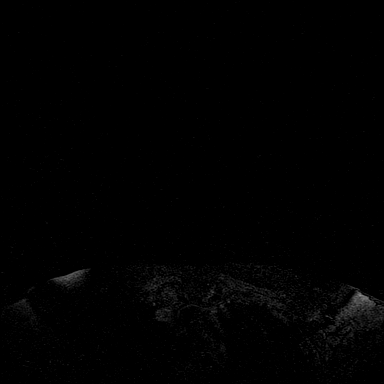

[Series 4: fl3d post-cm 20 · axial · 1.2mm · 0.94mm/px · z∈[-67,+105]mm · 8 of 144 slices shown (1 of 3)]
[im 1/144]
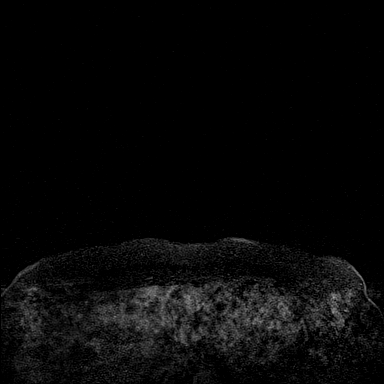
[im 23/144]
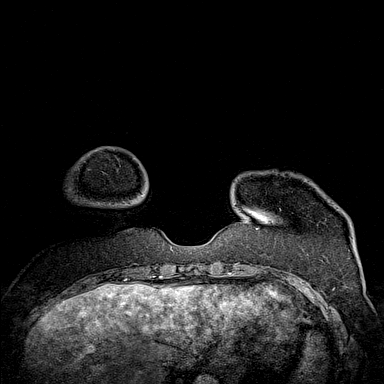
[im 45/144]
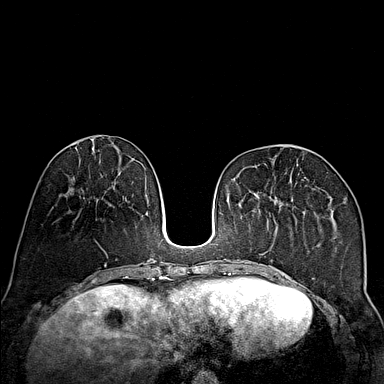
[im 67/144]
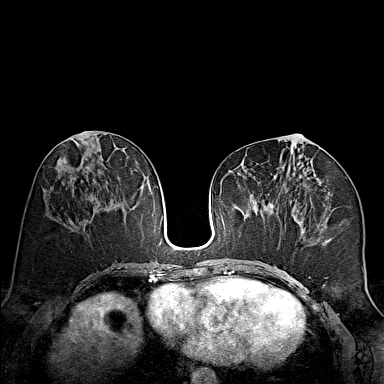
[im 78/144]
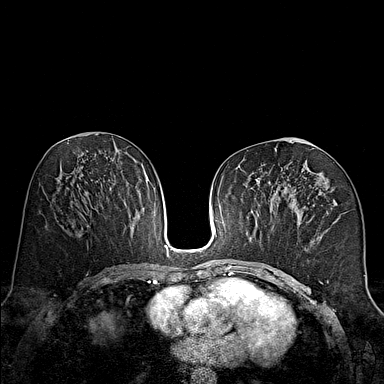
[im 100/144]
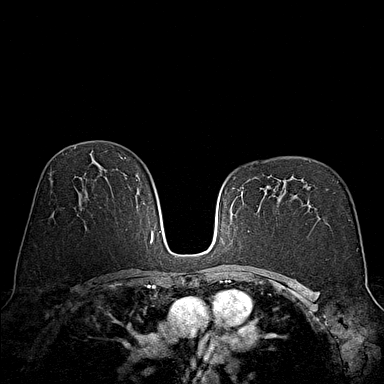
[im 122/144]
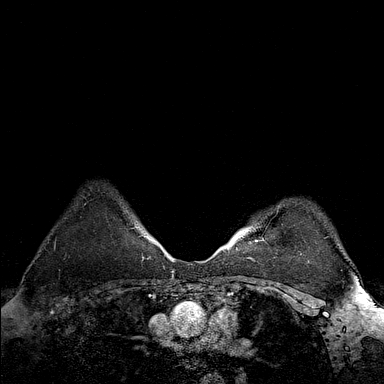
[im 144/144]
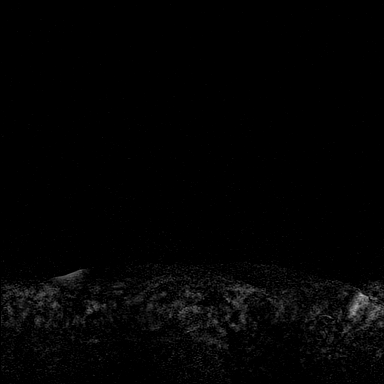

[Series 5: fl3d post-cm 20 · axial · 1.2mm · 0.94mm/px · z∈[-67,+105]mm · 8 of 144 slices shown (2 of 3)]
[im 1/144]
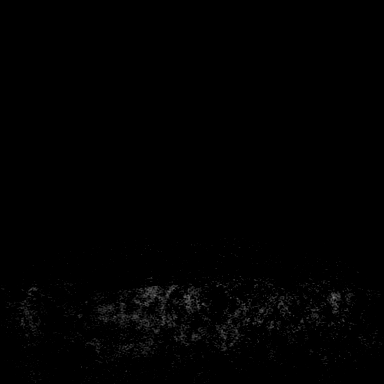
[im 23/144]
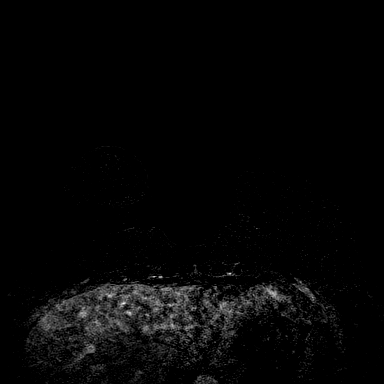
[im 45/144]
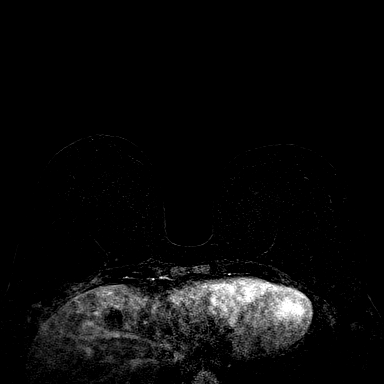
[im 67/144]
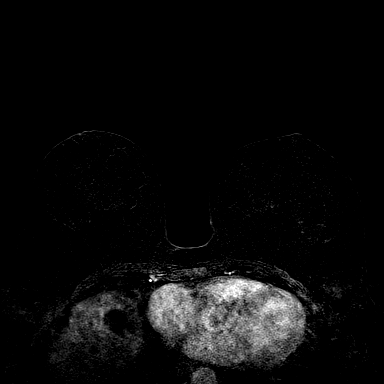
[im 78/144]
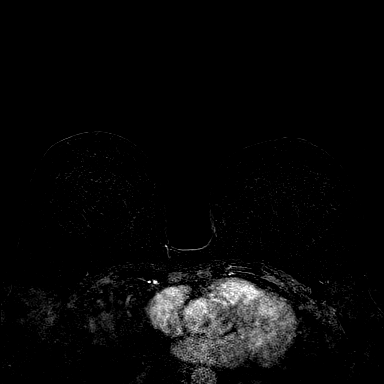
[im 100/144]
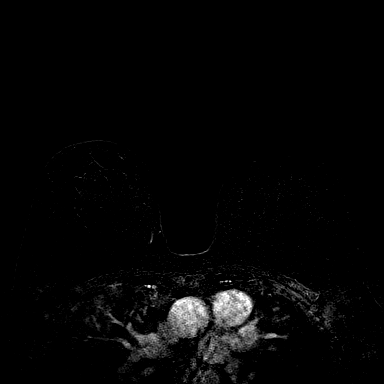
[im 122/144]
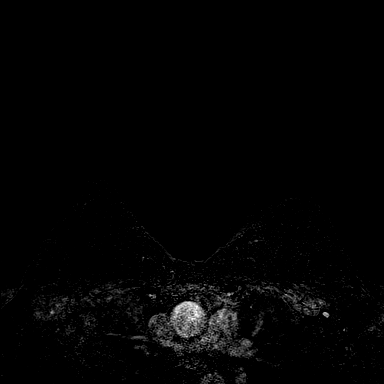
[im 144/144]
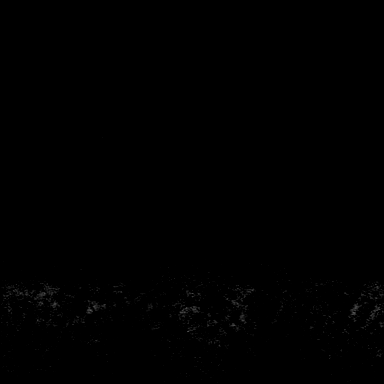

[Series 6: fl3d post-cm 20 · axial · 172.8mm · 0.94mm/px · 1 of 1 slices shown (3 of 3)]
[im 1/1]
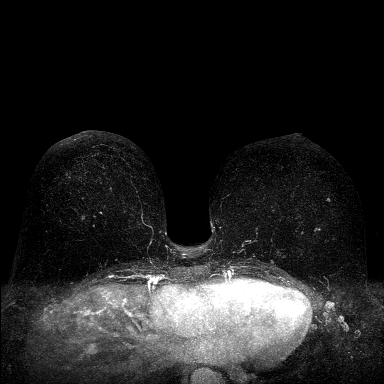

[30 of 48 positions shown; findings below may reference images not displayed]

Three-dimensional MR images were rendered by post-processing of the
original MR data on an independent workstation. The
three-dimensional MR images were interpreted, and findings are
reported in the following complete MRI report for this study. Three
dimensional images were evaluated at the independent [employer]
workstation
FINDINGS: Breast composition: c. Heterogeneous fibroglandular tissue.

Background parenchymal enhancement: Minimal.

Right breast: No mass or abnormal enhancement.

Left breast: No mass or abnormal enhancement.

Lymph nodes: No abnormal appearing lymph nodes.

Ancillary findings: There is a 3.3 cm T2 bright nonenhancing mass in
the superomedial aspect of the liver, compatible with a benign cyst.
IMPRESSION: 1.  No evidence of malignancy in the bilateral breasts.

RECOMMENDATION:
Recommend annual screening mammography. The patient is also eligible
for annual Abbreviated Breast MRI if she wishes.

BI-RADS CATEGORY  1: Negative.

## 2020-06-30 MED ORDER — GADOBUTROL 1 MMOL/ML IV SOLN
9.0000 mL | Freq: Once | INTRAVENOUS | Status: AC | PRN
  Administered 2020-06-30: 9 mL via INTRAVENOUS

## 2020-07-23 HISTORY — PX: WRIST SURGERY: SHX841

## 2020-07-27 ENCOUNTER — Other Ambulatory Visit: Payer: Self-pay

## 2020-07-27 ENCOUNTER — Other Ambulatory Visit: Payer: 59

## 2020-07-27 DIAGNOSIS — Z20822 Contact with and (suspected) exposure to covid-19: Secondary | ICD-10-CM

## 2020-07-30 LAB — NOVEL CORONAVIRUS, NAA

## 2020-08-04 ENCOUNTER — Telehealth (INDEPENDENT_AMBULATORY_CARE_PROVIDER_SITE_OTHER): Payer: 59 | Admitting: Family Medicine

## 2020-08-04 DIAGNOSIS — R0981 Nasal congestion: Secondary | ICD-10-CM

## 2020-08-04 DIAGNOSIS — R059 Cough, unspecified: Secondary | ICD-10-CM

## 2020-08-04 DIAGNOSIS — R062 Wheezing: Secondary | ICD-10-CM

## 2020-08-04 MED ORDER — ALBUTEROL SULFATE HFA 108 (90 BASE) MCG/ACT IN AERS: 2.0000 | INHALATION_SPRAY | Freq: Four times a day (QID) | RESPIRATORY_TRACT | 0 refills | Status: DC | PRN

## 2020-08-04 MED ORDER — DOXYCYCLINE HYCLATE 100 MG PO TABS: 100.0000 mg | ORAL_TABLET | Freq: Two times a day (BID) | ORAL | 0 refills | Status: DC

## 2020-08-04 NOTE — Progress Notes (Signed)
Virtual Visit via Video Note  I connected with Elona  on 08/04/20 at  6:00 PM EST by a video enabled telemedicine application and verified that I am speaking with the correct person using two identifiers.  Location patient: home, [place] Location provider:work or home office Persons participating in the virtual visit: patient, provider  I discussed the limitations of evaluation and management by telemedicine and the availability of in person appointments. The patient expressed understanding and agreed to proceed.   HPI:  Acute telemedicine visit for cough: -Onset: 2 weeks ago -had negative home covid test, non-conclusive test at test site -Symptoms include: started with sore throat and bad cough, sore throat is gone but the cough has lingered, some wheezing at time, hurts to cough, worsened sinus congestion is yellow -Denies:fevers, CP (except when coughs), SOB, NVD, body aches, hemoptysis -Has tried: delsym -Pertinent past medical history: denies lung or sinus history -Pertinent medication allergies:nkda per patient -COVID-19 vaccine status:fully vaccinated x3 for covid and had flu shot  ROS: See pertinent positives and negatives per HPI.  Past Medical History:  Diagnosis Date  . Hypertension   . Post-menopausal   . Sleep apnea    use cpap    Past Surgical History:  Procedure Laterality Date  . COLONOSCOPY  2015     Current Outpatient Medications:  .  albuterol (VENTOLIN HFA) 108 (90 Base) MCG/ACT inhaler, Inhale 2 puffs into the lungs every 6 (six) hours as needed for wheezing or shortness of breath., Disp: 1 each, Rfl: 0 .  doxycycline (VIBRA-TABS) 100 MG tablet, Take 1 tablet (100 mg total) by mouth 2 (two) times daily., Disp: 20 tablet, Rfl: 0 .  Calcium Carb-Cholecalciferol (CALCIUM 1000 + D) 1000-800 MG-UNIT TABS, calcium, Disp: , Rfl:  .  Cholecalciferol (VITAMIN D-3) 5000 units TABS, Take 1 capsule by mouth daily., Disp: , Rfl:  .  fluticasone (FLONASE) 50 MCG/ACT  nasal spray, Place into both nostrils daily as needed for allergies or rhinitis., Disp: , Rfl:  .  lisinopril (ZESTRIL) 10 MG tablet, Take 1 tablet (10 mg total) by mouth daily., Disp: 90 tablet, Rfl: 3 .  Multiple Vitamin (MULTIVITAMIN) capsule, Take by mouth. HERBALIFE, Disp: , Rfl:  .  Probiotic Product (PROBIOTIC PO), Take 1 capsule by mouth every morning., Disp: , Rfl:   EXAM:  VITALS per patient if applicable:  GENERAL: alert, oriented, appears well and in no acute distress  HEENT: atraumatic, conjunttiva clear, no obvious abnormalities on inspection of external nose and ears  NECK: normal movements of the head and neck  LUNGS: on inspection no signs of respiratory distress, breathing rate appears normal, no obvious gross SOB, gasping or wheezing  CV: no obvious cyanosis  MS: moves all visible extremities without noticeable abnormality  PSYCH/NEURO: pleasant and cooperative, no obvious depression or anxiety, speech and thought processing grossly intact  ASSESSMENT AND PLAN:  Discussed the following assessment and plan:  Cough  Sinus congestion  Wheeze  -we discussed possible serious and likely etiologies, options for evaluation and workup, limitations of telemedicine visit vs in person visit, treatment, treatment risks and precautions. Pt prefers to treat via telemedicine empirically rather than in person at this moment.  Given duration of symptoms and current symptoms, query sinusitis with bronchitis, versus other.  Opted for treatment with doxycycline 100 mg twice daily for 10 days, continuation of Delsym for cough and albuterol if any further wheezing.  Discussed and demonstrated proper use of the albuterol inhaler. Work/School slipped offered: declined Scheduled follow  up with PCP offered: Agrees to follow-up if needed Advised to seek prompt in person care if worsening, new symptoms arise, or if is not improving with treatment. Discussed options for inperson care if PCP  office not available. Did let this patient know that I only do telemedicine for [facility]. Advised to schedule follow up visit with PCP or [facility] if any further questions or concerns to avoid delays in care.   I discussed the assessment and treatment plan with the patient. The patient was provided an opportunity to ask questions and all were answered. The patient agreed with the plan and demonstrated an understanding of the instructions.     Lucretia Kern, DO

## 2020-08-04 NOTE — Patient Instructions (Signed)
-  I sent the medication(s) we discussed to your pharmacy: Meds ordered this encounter  Medications  . doxycycline (VIBRA-TABS) 100 MG tablet    Sig: Take 1 tablet (100 mg total) by mouth 2 (two) times daily.    Dispense:  20 tablet    Refill:  0  . albuterol (VENTOLIN HFA) 108 (90 Base) MCG/ACT inhaler    Sig: Inhale 2 puffs into the lungs every 6 (six) hours as needed for wheezing or shortness of breath.    Dispense:  1 each    Refill:  0     I hope you are feeling better soon!  Schedule a follow up visit through your primary care office or seek in person care promptly if your symptoms worsen, new concerns arise or you are not improving with treatment.  It was nice to meet you today. I help [facility] out with telemedicine visits on Tuesdays and Thursdays and am available for visits on those days. If you have any concerns or questions following this visit please schedule a follow up visit with your Primary Care doctor or seek care at a local urgent care clinic to avoid delays in care.

## 2020-09-22 ENCOUNTER — Encounter: Payer: Self-pay | Admitting: Family Medicine

## 2020-09-22 ENCOUNTER — Other Ambulatory Visit: Payer: Self-pay

## 2020-09-22 ENCOUNTER — Ambulatory Visit: Payer: 59 | Admitting: Family Medicine

## 2020-09-22 VITALS — BP 120/82 | HR 87 | Temp 97.3°F | Ht 67.0 in | Wt 188.0 lb

## 2020-09-22 DIAGNOSIS — L723 Sebaceous cyst: Secondary | ICD-10-CM

## 2020-09-22 MED ORDER — DOXYCYCLINE HYCLATE 100 MG PO TABS: 100.0000 mg | ORAL_TABLET | Freq: Two times a day (BID) | ORAL | 0 refills | Status: DC

## 2020-09-22 NOTE — Progress Notes (Signed)
This visit occurred during the SARS-CoV-2 public health emergency.  Safety protocols were in place, including screening questions prior to the visit, additional usage of staff PPE, and extensive cleaning of exam room while observing appropriate contact time as indicated for disinfecting solutions.  Lump noted between R side of neck and R shoulder.  Noted about 2 weeks ago.  Sore locally.  No FCNAVD.  No armpit or breast changes.    Meds, vitals, and allergies reviewed.   ROS: Per HPI unless specifically indicated in ROS section   nad ncat Neck supple, no LA 1.5 cm lesion on the R upper trap area. Minimally ttp, not red.   rrr ctab No LA in the axilla.

## 2020-09-22 NOTE — Patient Instructions (Addendum)
I would start the antibiotics and use warm compresses a few times a day.  It may drain on its own and that would be a good thing.  If so, then wash with soapy water and cover as needed.  I wouldn't put your purse strap on that side.   If it is worse or not resolving, then let us know.   Take care.  Glad to see you.

## 2020-09-24 DIAGNOSIS — L723 Sebaceous cyst: Secondary | ICD-10-CM | POA: Insufficient documentation

## 2020-09-24 NOTE — Assessment & Plan Note (Signed)
ddx d/w pt.  Options d/w pt.   Lesion is small and she likely does not need or would get benefit from incision and drainage at this point.  I am worried that the lesion is so small we would not be able to adequately pack it. I would start doxycycline and use warm compresses a few times a day.  It may drain on its own and that would be a good thing.  If so, then wash with soapy water and cover as needed.  I wouldn't put purse strap on that side.   If it is worse or not resolving, then let us know.  She agrees to plan.  She is aware that it could get worse and still end up needing incision and drainage in the future.

## 2021-01-29 ENCOUNTER — Encounter: Payer: Self-pay | Admitting: Family Medicine

## 2021-02-13 ENCOUNTER — Ambulatory Visit: Payer: 59 | Admitting: Family Medicine

## 2021-02-14 ENCOUNTER — Other Ambulatory Visit: Payer: Self-pay | Admitting: Obstetrics and Gynecology

## 2021-02-14 DIAGNOSIS — Z803 Family history of malignant neoplasm of breast: Secondary | ICD-10-CM

## 2021-02-22 ENCOUNTER — Ambulatory Visit: Payer: 59 | Admitting: Family Medicine

## 2021-05-30 ENCOUNTER — Other Ambulatory Visit: Payer: Self-pay | Admitting: Obstetrics and Gynecology

## 2021-05-30 DIAGNOSIS — Z803 Family history of malignant neoplasm of breast: Secondary | ICD-10-CM

## 2021-06-19 ENCOUNTER — Telehealth: Payer: Self-pay | Admitting: Family Medicine

## 2021-06-19 DIAGNOSIS — I10 Essential (primary) hypertension: Secondary | ICD-10-CM

## 2021-06-19 DIAGNOSIS — E559 Vitamin D deficiency, unspecified: Secondary | ICD-10-CM

## 2021-06-19 DIAGNOSIS — Z0000 Encounter for general adult medical examination without abnormal findings: Secondary | ICD-10-CM

## 2021-06-19 DIAGNOSIS — R7309 Other abnormal glucose: Secondary | ICD-10-CM

## 2021-06-19 NOTE — Telephone Encounter (Signed)
-----   Message from Ellamae Sia sent at 06/06/2021  3:36 PM EST ----- Regarding: Lab orders for Wednesday, 11.30.22 Patient is scheduled for CPX labs, please order future labs, Thanks , Karna Christmas

## 2021-06-21 ENCOUNTER — Other Ambulatory Visit: Payer: Managed Care, Other (non HMO)

## 2021-06-28 ENCOUNTER — Encounter: Payer: Managed Care, Other (non HMO) | Admitting: Family Medicine

## 2021-07-07 ENCOUNTER — Encounter: Payer: 59 | Admitting: Family Medicine

## 2021-07-11 ENCOUNTER — Ambulatory Visit
Admission: RE | Admit: 2021-07-11 | Discharge: 2021-07-11 | Disposition: A | Discharge status: Home / Self Care | Payer: No Typology Code available for payment source | Source: Ambulatory Visit | Attending: Obstetrics and Gynecology | Admitting: Obstetrics and Gynecology

## 2021-07-11 ENCOUNTER — Other Ambulatory Visit: Payer: Self-pay

## 2021-07-11 DIAGNOSIS — Z803 Family history of malignant neoplasm of breast: Secondary | ICD-10-CM

## 2021-07-11 IMAGING — MR MR BREAST WO/W CM  BILAT
4 of 5 series · 29 of 48 positions shown · IV contrast (gadavist)
Comparison: Prior abbreviated MRI dated [DATE]. Correlation
made with prior mammograms.
COMPARISON: Prior abbreviated MRI dated [DATE]. Correlation
made with prior mammograms.

Addendum:
CLINICAL DATA: Abbreviated breast MRI for breast cancer screening.
Family history of breast cancer in her mother at the age of 50 and
60.

EXAM:
BILATERAL BREAST MRI WITH AND WITHOUT CONTRAST
TECHNIQUE: Multiplanar, multisequence MR images of both breasts were obtained
prior to and following the intravenous administration of ml of
Gadavist

[Series 2: t2_tirm_tra ipat (a-p) · axial · 3.0mm · 0.70mm/px · z∈[-85,+86]mm · 5 of 58 slices shown]
[im 1/58]
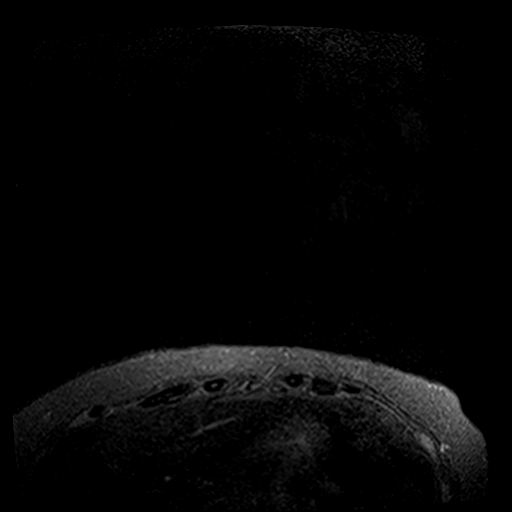
[im 15/58]
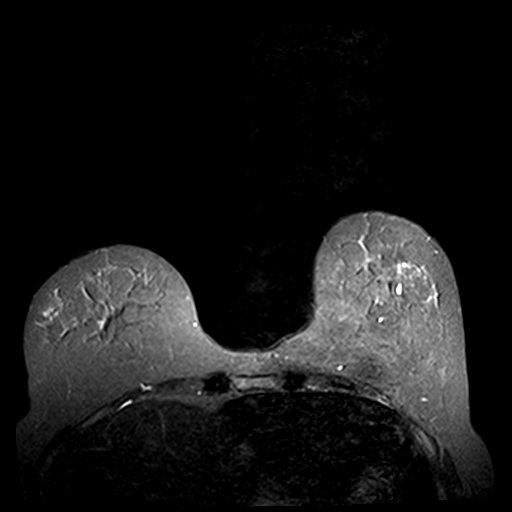
[im 29/58]
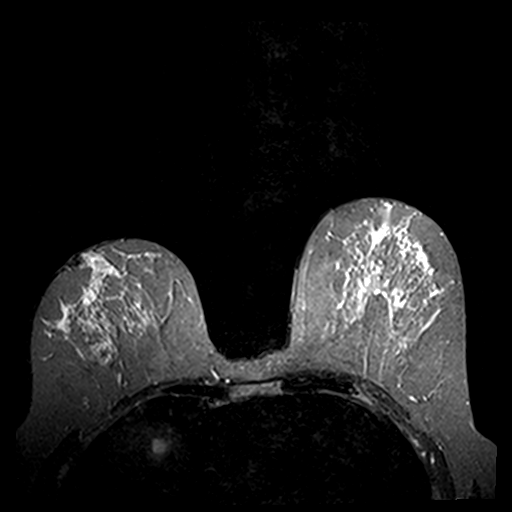
[im 43/58]
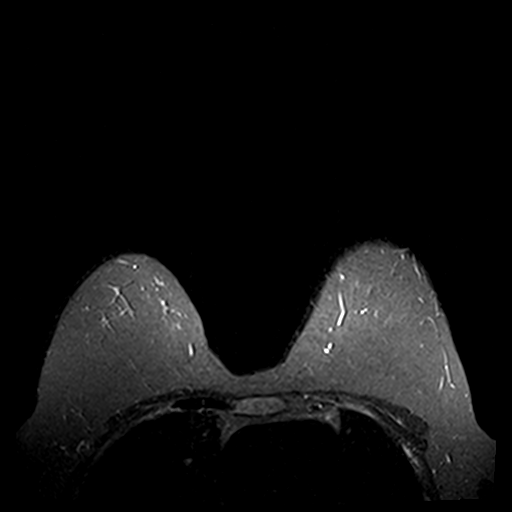
[im 58/58]
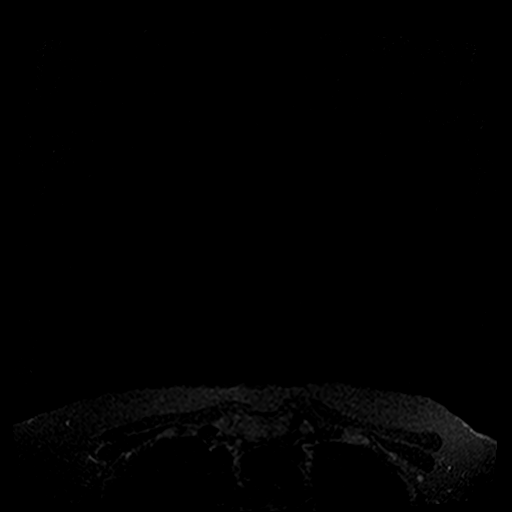

[Series 3: fl3d pre-cm · axial · non-contrast · 1.2mm · 0.94mm/px · z∈[-85,+86]mm · 8 of 144 slices shown]
[im 1/144]
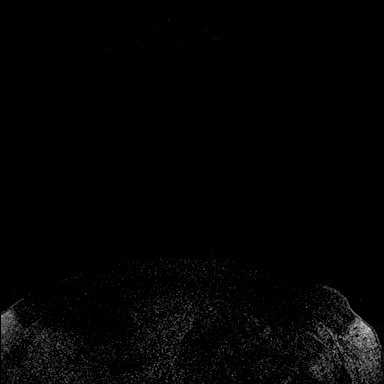
[im 23/144]
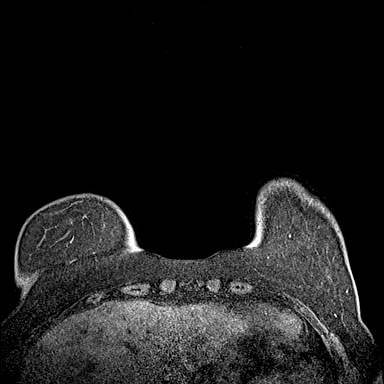
[im 45/144]
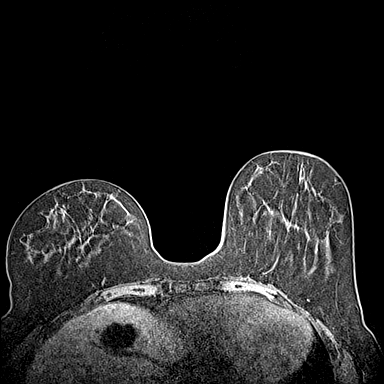
[im 67/144]
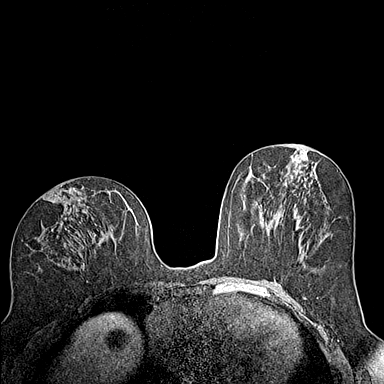
[im 78/144]
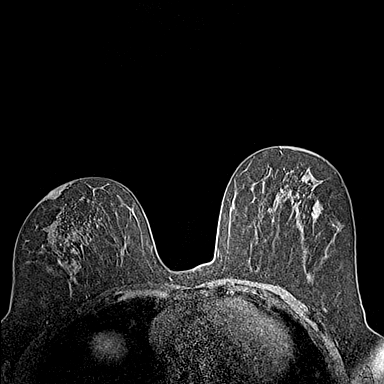
[im 100/144]
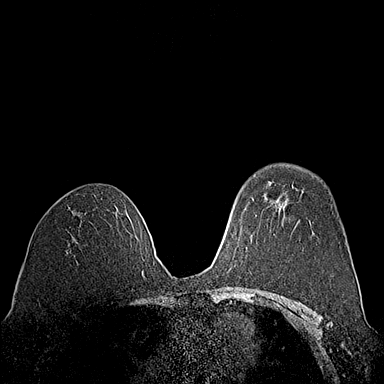
[im 122/144]
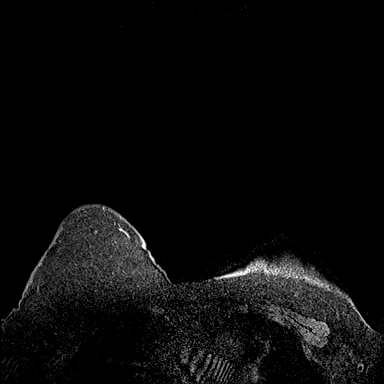
[im 144/144]
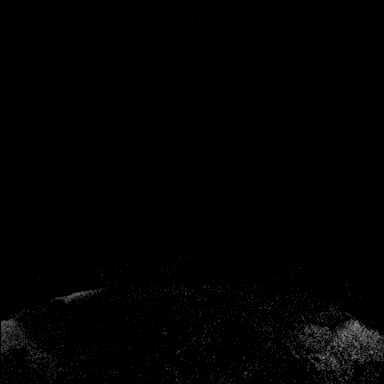

[Series 4: fl3d post-cm 20 · axial · 1.2mm · 0.94mm/px · z∈[-85,+86]mm · 8 of 144 slices shown (1 of 2)]
[im 1/144]
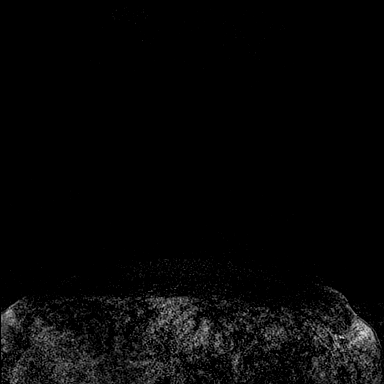
[im 23/144]
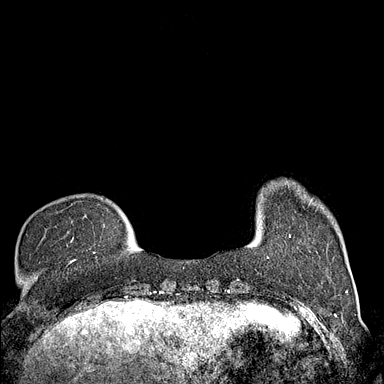
[im 45/144]
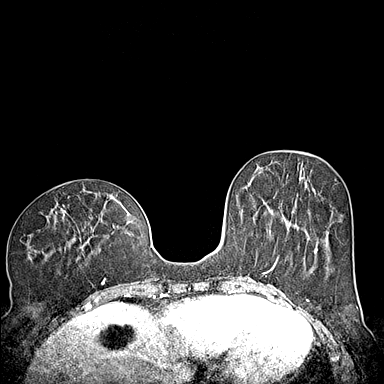
[im 67/144]
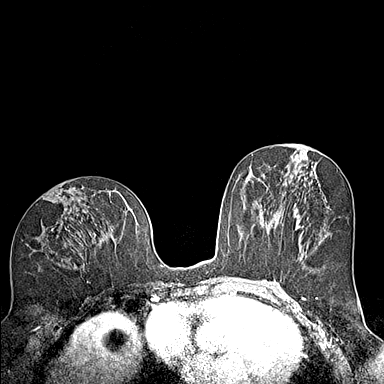
[im 78/144]
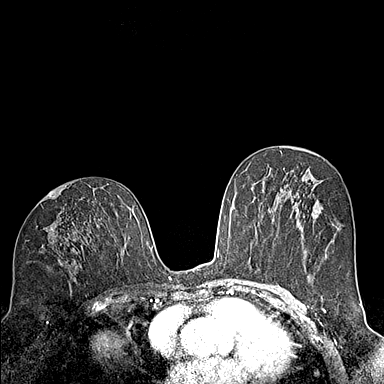
[im 100/144]
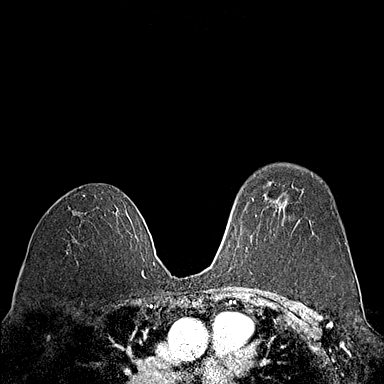
[im 122/144]
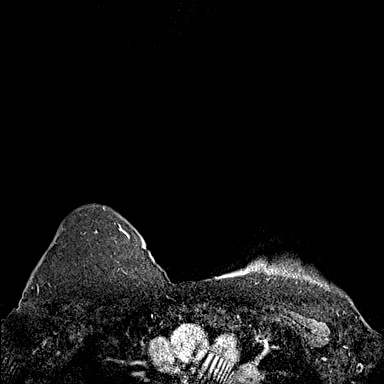
[im 144/144]
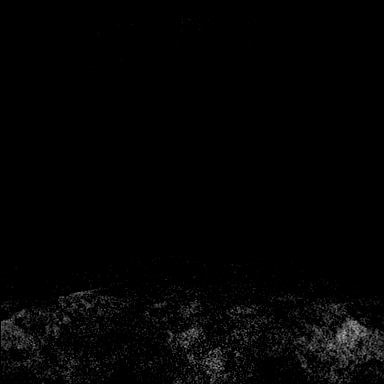

[Series 5: fl3d post-cm 20 · axial · 1.2mm · 0.94mm/px · z∈[-85,+86]mm · 8 of 144 slices shown (2 of 2)]
[im 1/144]
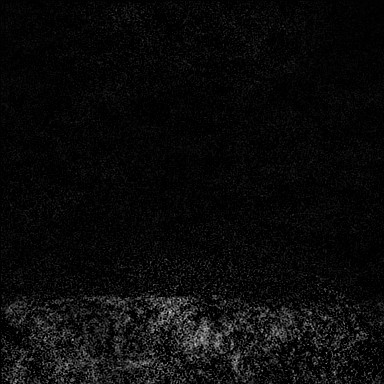
[im 23/144]
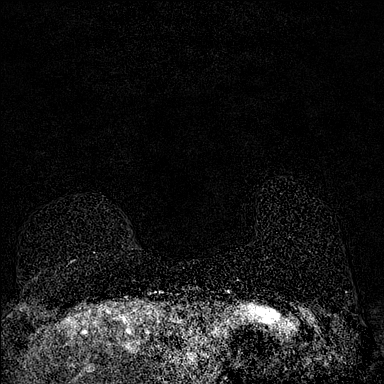
[im 45/144]
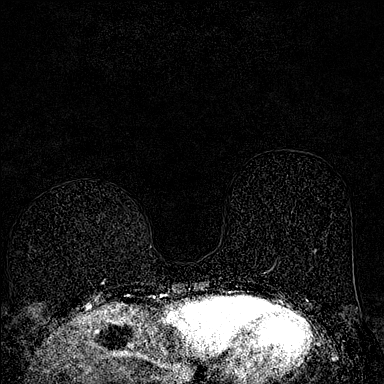
[im 67/144]
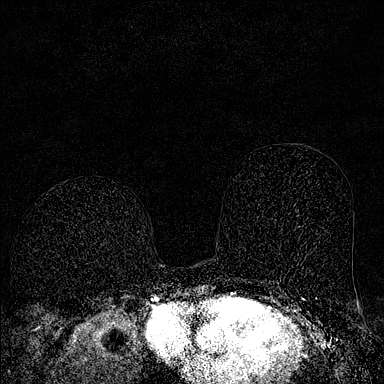
[im 78/144]
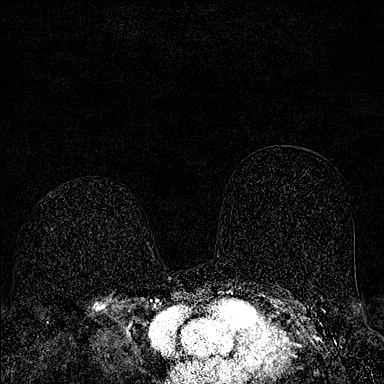
[im 100/144]
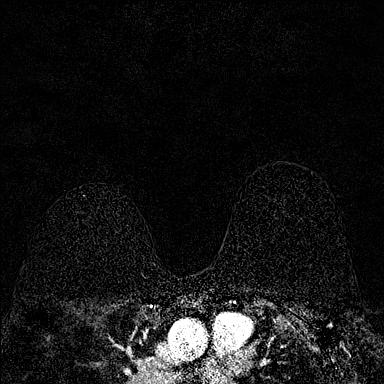
[im 122/144]
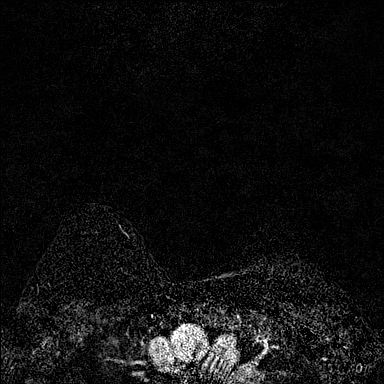
[im 144/144]
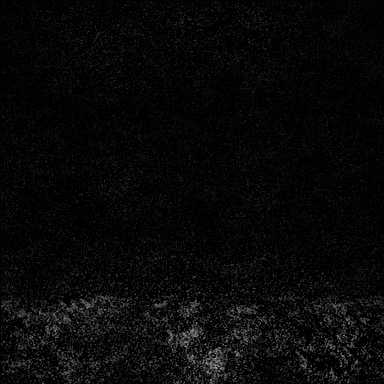

[29 of 48 positions shown; findings below may reference images not displayed]

Three-dimensional MR images were rendered by post-processing of the
original MR data on an independent workstation. The
three-dimensional MR images were interpreted, and findings are
reported in the following complete MRI report for this study. Three
dimensional images were evaluated at the independent interpreting
workstation using the DynaCAD thin client.
FINDINGS: Breast composition: c. Heterogeneous fibroglandular tissue.

Background parenchymal enhancement: Minimal

Right breast: No mass or abnormal enhancement.

Left breast: No mass or abnormal enhancement.

Lymph nodes: No abnormal appearing lymph nodes.

Ancillary findings: Stable 3.3 cm T2 bright nonenhancing mass in the
superomedial aspect of the liver, compatible with a benign cyst.
IMPRESSION: No abnormal enhancement in either breast.

RECOMMENDATION:
Bilateral screening mammogram in [DATE] is recommended.

BI-RADS CATEGORY  1: Negative.

ADDENDUM:
8 mL of Gadavist was administered intravenously.

*** End of Addendum ***
Three-dimensional MR images were rendered by post-processing of the
original MR data on an independent workstation. The
three-dimensional MR images were interpreted, and findings are
reported in the following complete MRI report for this study. Three
dimensional images were evaluated at the independent interpreting
workstation using the DynaCAD thin client.
FINDINGS: Breast composition: c. Heterogeneous fibroglandular tissue.

Background parenchymal enhancement: Minimal

Right breast: No mass or abnormal enhancement.

Left breast: No mass or abnormal enhancement.

Lymph nodes: No abnormal appearing lymph nodes.

Ancillary findings: Stable 3.3 cm T2 bright nonenhancing mass in the
superomedial aspect of the liver, compatible with a benign cyst.
IMPRESSION: No abnormal enhancement in either breast.

RECOMMENDATION:
Bilateral screening mammogram in [DATE] is recommended.

BI-RADS CATEGORY  1: Negative.

## 2021-07-11 MED ORDER — GADOBUTROL 1 MMOL/ML IV SOLN
8.0000 mL | Freq: Once | INTRAVENOUS | Status: AC | PRN
  Administered 2021-07-11: 17:00:00 8 mL via INTRAVENOUS

## 2021-07-19 ENCOUNTER — Other Ambulatory Visit: Payer: Self-pay | Admitting: Family Medicine

## 2021-07-25 ENCOUNTER — Encounter: Payer: Self-pay | Admitting: Family Medicine

## 2021-07-25 ENCOUNTER — Other Ambulatory Visit: Payer: Self-pay

## 2021-07-25 ENCOUNTER — Ambulatory Visit (INDEPENDENT_AMBULATORY_CARE_PROVIDER_SITE_OTHER): Payer: 59 | Admitting: Family Medicine

## 2021-07-25 VITALS — BP 131/80 | HR 74 | Temp 97.6°F | Ht 67.25 in | Wt 184.0 lb

## 2021-07-25 DIAGNOSIS — Z0000 Encounter for general adult medical examination without abnormal findings: Secondary | ICD-10-CM

## 2021-07-25 DIAGNOSIS — M85859 Other specified disorders of bone density and structure, unspecified thigh: Secondary | ICD-10-CM

## 2021-07-25 DIAGNOSIS — I10 Essential (primary) hypertension: Secondary | ICD-10-CM | POA: Diagnosis not present

## 2021-07-25 DIAGNOSIS — R7309 Other abnormal glucose: Secondary | ICD-10-CM | POA: Diagnosis not present

## 2021-07-25 DIAGNOSIS — E559 Vitamin D deficiency, unspecified: Secondary | ICD-10-CM | POA: Diagnosis not present

## 2021-07-25 LAB — COMPREHENSIVE METABOLIC PANEL
ALT: 23 U/L (ref 0–35)
AST: 20 U/L (ref 0–37)
Albumin: 4.3 g/dL (ref 3.5–5.2)
Alkaline Phosphatase: 82 U/L (ref 39–117)
BUN: 16 mg/dL (ref 6–23)
CO2: 27 mEq/L (ref 19–32)
Calcium: 9.9 mg/dL (ref 8.4–10.5)
Chloride: 105 mEq/L (ref 96–112)
Creatinine, Ser: 0.76 mg/dL (ref 0.40–1.20)
GFR: 83.31 mL/min (ref >60.00)
Glucose, Bld: 85 mg/dL (ref 70–99)
Potassium: 4.4 mEq/L (ref 3.5–5.1)
Sodium: 140 mEq/L (ref 135–145)
Total Bilirubin: 0.8 mg/dL (ref 0.2–1.2)
Total Protein: 6.5 g/dL (ref 6.0–8.3)

## 2021-07-25 LAB — LIPID PANEL
Cholesterol: 217 mg/dL — ABNORMAL HIGH (ref 0–200)
HDL: 83.2 mg/dL (ref >39.00)
LDL Cholesterol: 112 mg/dL — ABNORMAL HIGH (ref 0–99)
NonHDL: 134.11
Total CHOL/HDL Ratio: 3
Triglycerides: 111 mg/dL (ref 0.0–149.0)
VLDL: 22.2 mg/dL (ref 0.0–40.0)

## 2021-07-25 LAB — VITAMIN D 25 HYDROXY (VIT D DEFICIENCY, FRACTURES): VITD: 71.62 ng/mL (ref 30.00–100.00)

## 2021-07-25 LAB — CBC WITH DIFFERENTIAL/PLATELET
Basophils Absolute: 0.1 10*3/uL (ref 0.0–0.1)
Basophils Relative: 0.8 % (ref 0.0–3.0)
Eosinophils Absolute: 0.3 10*3/uL (ref 0.0–0.7)
Eosinophils Relative: 5 % (ref 0.0–5.0)
HCT: 40.9 % (ref 36.0–46.0)
Hemoglobin: 13.6 g/dL (ref 12.0–15.0)
Lymphocytes Relative: 21.6 % (ref 12.0–46.0)
Lymphs Abs: 1.4 10*3/uL (ref 0.7–4.0)
MCHC: 33.3 g/dL (ref 30.0–36.0)
MCV: 87.4 fl (ref 78.0–100.0)
Monocytes Absolute: 0.4 10*3/uL (ref 0.1–1.0)
Monocytes Relative: 6.3 % (ref 3.0–12.0)
Neutro Abs: 4.4 10*3/uL (ref 1.4–7.7)
Neutrophils Relative %: 66.3 % (ref 43.0–77.0)
Platelets: 236 10*3/uL (ref 150.0–400.0)
RBC: 4.69 Mil/uL (ref 3.87–5.11)
RDW: 12.8 % (ref 11.5–15.5)
WBC: 6.6 10*3/uL (ref 4.0–10.5)

## 2021-07-25 LAB — TSH: TSH: 2.29 u[IU]/mL (ref 0.35–5.50)

## 2021-07-25 LAB — HEMOGLOBIN A1C: Hgb A1c MFr Bld: 5.2 % (ref 4.6–6.5)

## 2021-07-25 MED ORDER — LISINOPRIL 10 MG PO TABS: 10.0000 mg | ORAL_TABLET | Freq: Every day | ORAL | 3 refills | Status: DC

## 2021-07-25 NOTE — Assessment & Plan Note (Signed)
D level with labs today  Pt has osteopenia  Discussed importance of supplementation with FDA recommendation for bone and overall health

## 2021-07-25 NOTE — Assessment & Plan Note (Signed)
bp in fair control at this time  BP Readings from Last 1 Encounters:  07/25/21 131/80   No changes needed Most recent labs reviewed  Disc lifstyle change with low sodium diet and exercise  Plan to continue lisinopril 10 mg daily

## 2021-07-25 NOTE — Progress Notes (Signed)
Subjective:    Patient ID: Deborah Orozco, female    DOB: 08/28/1955, 66 y.o.   MRN: [id number]  This visit occurred during the SARS-CoV-2 public health emergency.  Safety protocols were in place, including screening questions prior to the visit, additional usage of staff PPE, and extensive cleaning of exam room while observing appropriate contact time as indicated for disinfecting solutions.   HPI Here for health maintenance exam and to review chronic medical problems    Wt Readings from Last 3 Encounters:  07/25/21 184 lb (83.5 kg)  09/22/20 188 lb (85.3 kg)  06/27/20 188 lb (85.3 kg)   28.60 kg/m  Has a new grandchild   Mammogram 07/2017 Self breast exam-no lumps  MRI done last month -normal and recommended mam in July  Mother had breast cancer   Pap 08/2017 Nl with neg HPV screen  At gyn Takes ca and D for bone health Will have routine gyn visit    Osteopenia -followed by gyn Arm fracture this year-had to have surgery (dog pulled her with leash)  Healing well/PT  Dexa 07/2017 Vit D- takes ca and D   Exercise - less with pandemic  Bad eating during holiday  Walking for exercise- will get back to weight   Colonoscopy 05/2020 with 5 y recall  Mother had colon cancer   Tdap 8/19 Flu shot -November  Zoster status-had shingrix Going to niece's wedding [place] Trip in feb Will need yellow fever vaccine and malaria prophylaxis    HTN  bp is stable today  No cp or palpitations or headaches or edema  No side effects to medicines  BP Readings from Last 3 Encounters:  07/25/21 131/80  09/22/20 120/82  06/27/20 130/84    Takes lisinopril 10 mg daily   Lab Results  Component Value Date   CREATININE 0.75 06/23/2020   BUN 20 06/23/2020   NA 142 06/23/2020   K 4.3 06/23/2020   CL 107 06/23/2020   CO2 27 06/23/2020   Lab Results  Component Value Date   HGBA1C 5.1 06/23/2020   Due for labs   Patient Active Problem List   Diagnosis Date Noted    Sebaceous cyst 09/24/2020   High myopia, both eyes 06/04/2019   Dizziness 12/14/2018   Hypertension, essential 08/20/2018   Dysthymia 03/03/2018   Elevated random blood glucose level 02/28/2018   Fatigue 04/01/2017   Osteopenia 07/05/2015   Family history of colon cancer 11/20/2013   Nuclear sclerotic cataract of both eyes 04/01/2013   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   Actinic keratosis 12/12/2012   Colon cancer screening 07/09/2012   Breast screening, unspecified 04/08/2012   Routine general medical examination at a health care facility 12/20/2011   Vitamin D deficiency 03/14/2010   Past Medical History:  Diagnosis Date   Hypertension    Post-menopausal    Sleep apnea    use cpap   Past Surgical History:  Procedure Laterality Date   COLONOSCOPY  2015   Social History   Tobacco Use   Smoking status: Never   Smokeless tobacco: Never  Vaping Use   Vaping Use: Never used  Substance Use Topics   Alcohol use: Yes    Alcohol/week: 2.0 standard drinks    Types: 2 Glasses of wine per week    Comment: occ   Drug use: No   Family History  Problem Relation Age of Onset   Cancer Mother 32       Breast twice recurred at 46/colon/liver  Diabetes Mother    Hypertension Mother    Colon cancer Mother 25   Hypertension Father    Heart disease Brother    Colon polyps Brother    Cancer Maternal Grandfather        possible colon cancer unsure of exact location gastro/tn   Stomach cancer Maternal Grandmother    Esophageal cancer Neg Hx    Rectal cancer Neg Hx    Allergies  Allergen Reactions   Amlodipine     dizzy   Current Outpatient Medications on File Prior to Visit  Medication Sig Dispense Refill   Calcium Carb-Cholecalciferol (CALCIUM 1000 + D) 1000-800 MG-UNIT TABS calcium     Cholecalciferol (VITAMIN D-3) 5000 units TABS Take 1 capsule by mouth daily.     Multiple Vitamin (MULTIVITAMIN) capsule Take by mouth. HERBALIFE     Probiotic Product (PROBIOTIC PO)  Take 1 capsule by mouth every morning.     No current facility-administered medications on file prior to visit.    Review of Systems  Constitutional:  Negative for activity change, appetite change, fatigue, fever and unexpected weight change.  HENT:  Negative for congestion, ear pain, rhinorrhea, sinus pressure and sore throat.   Eyes:  Negative for pain, redness and visual disturbance.  Respiratory:  Negative for cough, shortness of breath and wheezing.   Cardiovascular:  Negative for chest pain and palpitations.  Gastrointestinal:  Negative for abdominal pain, blood in stool, constipation and diarrhea.  Endocrine: Negative for polydipsia and polyuria.  Genitourinary:  Negative for dysuria, frequency and urgency.  Musculoskeletal:  Negative for arthralgias, back pain and myalgias.  Skin:  Negative for pallor and rash.  Allergic/Immunologic: Negative for environmental allergies.  Neurological:  Negative for dizziness, syncope and headaches.  Hematological:  Negative for adenopathy. Does not bruise/bleed easily.  Psychiatric/Behavioral:  Negative for decreased concentration and dysphoric mood. The patient is not nervous/anxious.       Objective:   Physical Exam Constitutional:      General: She is not in acute distress.    Appearance: Normal appearance. She is well-developed and normal weight. She is not ill-appearing or diaphoretic.  HENT:     Head: Normocephalic and atraumatic.     Right Ear: Tympanic membrane, ear canal and external ear normal.     Left Ear: Tympanic membrane, ear canal and external ear normal.     Nose: Nose normal. No congestion.     Mouth/Throat:     Mouth: Mucous membranes are moist.     Pharynx: Oropharynx is clear. No posterior oropharyngeal erythema.  Eyes:     General: No scleral icterus.    Extraocular Movements: Extraocular movements intact.     Conjunctiva/sclera: Conjunctivae normal.     Pupils: Pupils are equal, round, and reactive to light.   Neck:     Thyroid: No thyromegaly.     Vascular: No carotid bruit or JVD.  Cardiovascular:     Rate and Rhythm: Normal rate and regular rhythm.     Pulses: Normal pulses.     Heart sounds: Normal heart sounds.    No gallop.  Pulmonary:     Effort: Pulmonary effort is normal. No respiratory distress.     Breath sounds: Normal breath sounds. No wheezing.     Comments: Good air exch Chest:     Chest wall: No tenderness.  Abdominal:     General: Bowel sounds are normal. There is no distension or abdominal bruit.     Palpations: Abdomen is soft.  There is no mass.     Tenderness: There is no abdominal tenderness.     Hernia: No hernia is present.  Genitourinary:    Comments: Breast exam: No mass, nodules, thickening, tenderness, bulging, retraction, inflamation, nipple discharge or skin changes noted.  No axillary or clavicular LA.     Musculoskeletal:        General: No tenderness. Normal range of motion.     Cervical back: Normal range of motion and neck supple. No rigidity. No muscular tenderness.     Right lower leg: No edema.     Left lower leg: No edema.     Comments: No kyphosis   Lymphadenopathy:     Cervical: No cervical adenopathy.  Skin:    General: Skin is warm and dry.     Coloration: Skin is not pale.     Findings: No erythema or rash.     Comments: Solar lentigines diffusely  Residual seb cyst in r collarbone area   Neurological:     Mental Status: She is alert. Mental status is at baseline.     Cranial Nerves: No cranial nerve deficit.     Motor: No abnormal muscle tone.     Coordination: Coordination normal.     Gait: Gait normal.     Deep Tendon Reflexes: Reflexes are normal and symmetric. Reflexes normal.  Psychiatric:        Mood and Affect: Mood normal.        Cognition and Memory: Cognition and memory normal.          Assessment & Plan:   Problem List Items Addressed This Visit       Cardiovascular and Mediastinum   Hypertension, essential     bp in fair control at this time  BP Readings from Last 1 Encounters:  07/25/21 131/80  No changes needed Most recent labs reviewed  Disc lifstyle change with low sodium diet and exercise  Plan to continue lisinopril 10 mg daily      Relevant Medications   lisinopril (ZESTRIL) 10 MG tablet     Musculoskeletal and Integument   Osteopenia    Managed by gyn, did have a wrist fracture Taking ca and D Will plan dexa at gyn with next visit  Discussed fall prevention  D level with labs today        Other   Vitamin D deficiency    D level with labs today  Pt has osteopenia  Discussed importance of supplementation with FDA recommendation for bone and overall health       Routine general medical examination at a health care facility - Primary    Reviewed health habits including diet and exercise and skin cancer prevention Reviewed appropriate screening tests for age  Also reviewed health mt list, fam hx and immunization status , as well as social and family history   See HPI Labs ordered  Mammogram (gyn) and MRI are up to date for breast cancer screening  Pap utd/gyn care  dexa planned this year (one fall and fracture) Enc ca and D and exercise  She will check in with health dept travel clinic for trip to [place]       Elevated random blood glucose level    A1C ordered disc imp of low glycemic diet and wt loss to prevent DM2

## 2021-07-25 NOTE — Assessment & Plan Note (Signed)
Reviewed health habits including diet and exercise and skin cancer prevention Reviewed appropriate screening tests for age  Also reviewed health mt list, fam hx and immunization status , as well as social and family history   See HPI Labs ordered  Mammogram (gyn) and MRI are up to date for breast cancer screening  Pap utd/gyn care  dexa planned this year (one fall and fracture) Enc ca and D and exercise  She will check in with health dept travel clinic for trip to [place]

## 2021-07-25 NOTE — Assessment & Plan Note (Signed)
A1C ordered °disc imp of low glycemic diet and wt loss to prevent DM2  °

## 2021-07-25 NOTE — Assessment & Plan Note (Signed)
Managed by gyn, did have a wrist fracture Taking ca and D Will plan dexa at gyn with next visit  Discussed fall prevention  D level with labs today

## 2021-07-25 NOTE — Patient Instructions (Addendum)
Call the health dept to schedule a travel clinic appointment   Take care of yourself  Get back to exercise when you can   Labs today   Continue calcium and vitamin D

## 2021-09-05 ENCOUNTER — Other Ambulatory Visit: Payer: Self-pay

## 2021-09-05 ENCOUNTER — Telehealth (INDEPENDENT_AMBULATORY_CARE_PROVIDER_SITE_OTHER): Payer: 59 | Admitting: Family Medicine

## 2021-09-05 ENCOUNTER — Encounter: Payer: Self-pay | Admitting: Family Medicine

## 2021-09-05 DIAGNOSIS — J0100 Acute maxillary sinusitis, unspecified: Secondary | ICD-10-CM

## 2021-09-05 DIAGNOSIS — J019 Acute sinusitis, unspecified: Secondary | ICD-10-CM | POA: Insufficient documentation

## 2021-09-05 MED ORDER — AMOXICILLIN-POT CLAVULANATE 875-125 MG PO TABS: 1.0000 | ORAL_TABLET | Freq: Two times a day (BID) | ORAL | 0 refills | Status: DC

## 2021-09-05 NOTE — Patient Instructions (Signed)
Drink fluids and get rest when you can Use flonase daily  Nasal saline is helpful   Afrin is ok for the flight-do not use longer than that Take augmentin as directed Update if not starting to improve in a week or if worsening

## 2021-09-05 NOTE — Progress Notes (Signed)
Symptom onset 10 days ago. No one around the Patient sick. Negative home Covid test. Blowing nose with green mucus and some blood. Slight productive cough.  States she has been around sick daycare grandchildren. All their test came back negative.

## 2021-09-05 NOTE — Assessment & Plan Note (Addendum)
S/p uri  Colored nasal d/c and facial pressure and ear pressure Px augmentin Symptom care rev ER precautions rev Fluids/saline/steam advil cold and sinus  Can use flonase daily  For upcoming flight -afrin (short term) if needed Update if not starting to improve in a week or if worsening   Meds ordered this encounter  Medications   amoxicillin-clavulanate (AUGMENTIN) 875-125 MG tablet    Sig: Take 1 tablet by mouth 2 (two) times daily.    Dispense:  14 tablet    Refill:  0

## 2021-09-05 NOTE — Progress Notes (Signed)
Virtual Visit via Video Note  I connected with [name] on 09/05/21 at  8:00 AM EST by a video enabled telemedicine application and verified that I am speaking with the correct person using two identifiers.  Location: Patient: home Provider: office   I discussed the limitations of evaluation and management by telemedicine and the availability of in person appointments. The patient expressed understanding and agreed to proceed.  Parties involved in encounter  Patient: Deborah Orozco  Provider:  Loura Pardon MD   History of Present Illness: Pt presents with sinus symptoms   10 days of symptoms  Nasal congestion with green mucous  Exp to grand kids in day care with colds/ tested neg for covid She tested neg for covid also   She leaves for a trip to [place] on Friday   Pressure in sinuses and ears  Some runny nose also  No facial swelling  Green mucous and some blood also   Cough- a little bit/phlegm in her throat No fever  No chills or body aches  Did not keep her from working A little tired  ST is gone now   Otc: Advil cold and sinus  Used flonase for a few days   Has not used any saline     Patient Active Problem List   Diagnosis Date Noted   Sebaceous cyst 09/24/2020   High myopia, both eyes 06/04/2019   Dizziness 12/14/2018   Hypertension, essential 08/20/2018   Dysthymia 03/03/2018   Elevated random blood glucose level 02/28/2018   Fatigue 04/01/2017   Osteopenia 07/05/2015   Family history of colon cancer 11/20/2013   Nuclear sclerotic cataract of both eyes 04/01/2013   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   Actinic keratosis 12/12/2012   Colon cancer screening 07/09/2012   Breast screening, unspecified 04/08/2012   Routine general medical examination at a health care facility 12/20/2011   Vitamin D deficiency 03/14/2010   Past Medical History:  Diagnosis Date   Hypertension    Post-menopausal    Sleep apnea    use cpap   Past  Surgical History:  Procedure Laterality Date   COLONOSCOPY  2015   Social History   Tobacco Use   Smoking status: Never   Smokeless tobacco: Never  Vaping Use   Vaping Use: Never used  Substance Use Topics   Alcohol use: Yes    Alcohol/week: 2.0 standard drinks    Types: 2 Glasses of wine per week    Comment: occ   Drug use: No   Family History  Problem Relation Age of Onset   Cancer Mother 38       Breast twice recurred at 46/colon/liver   Diabetes Mother    Hypertension Mother    Colon cancer Mother 81   Hypertension Father    Heart disease Brother    Colon polyps Brother    Cancer Maternal Grandfather        possible colon cancer unsure of exact location gastro/tn   Stomach cancer Maternal Grandmother    Esophageal cancer Neg Hx    Rectal cancer Neg Hx    Allergies  Allergen Reactions   Amlodipine     dizzy   Current Outpatient Medications on File Prior to Visit  Medication Sig Dispense Refill   Calcium Carb-Cholecalciferol (CALCIUM 1000 + D) 1000-800 MG-UNIT TABS calcium     Cholecalciferol (VITAMIN D-3) 5000 units TABS Take 1 capsule by mouth daily.     lisinopril (ZESTRIL) 10 MG tablet Take 1 tablet (10  mg total) by mouth daily. 90 tablet 3   Multiple Vitamin (MULTIVITAMIN) capsule Take by mouth. HERBALIFE     Probiotic Product (PROBIOTIC PO) Take 1 capsule by mouth every morning.     No current facility-administered medications on file prior to visit.   Review of Systems  Constitutional:  Negative for chills, fever and malaise/fatigue.  HENT:  Positive for congestion and sinus pain. Negative for ear pain and sore throat.   Eyes:  Negative for blurred vision, discharge and redness.  Respiratory:  Negative for cough, sputum production, shortness of breath, wheezing and stridor.   Cardiovascular:  Negative for chest pain, palpitations and leg swelling.  Gastrointestinal:  Negative for abdominal pain, diarrhea, nausea and vomiting.  Musculoskeletal:  Negative  for myalgias.  Skin:  Negative for rash.  Neurological:  Positive for headaches. Negative for dizziness.   Observations/Objective: Patient appears well, in no distress Weight is baseline  No facial swelling or asymmetry Normal voice-not hoarse and no slurred speech Sounds congested (nasal) No obvious tremor or mobility impairment Moving neck and UEs normally Able to hear the call well  No cough or shortness of breath during interview  Talkative and mentally sharp with no cognitive changes No skin changes on face or neck , no rash or pallor Affect is normal    Assessment and Plan: Problem List Items Addressed This Visit       Respiratory   Acute sinusitis    S/p uri  Colored nasal d/c and facial pressure and ear pressure Px augmentin Symptom care rev ER precautions rev Fluids/saline/steam advil cold and sinus  Can use flonase daily  For upcoming flight -afrin (short term) if needed Update if not starting to improve in a week or if worsening   Meds ordered this encounter  Medications   amoxicillin-clavulanate (AUGMENTIN) 875-125 MG tablet    Sig: Take 1 tablet by mouth 2 (two) times daily.    Dispense:  14 tablet    Refill:  0         Relevant Medications   amoxicillin-clavulanate (AUGMENTIN) 875-125 MG tablet       Follow Up Instructions: Drink fluids and get rest when you can Use flonase daily  Nasal saline is helpful   Afrin is ok for the flight-do not use longer than that Take augmentin as directed Update if not starting to improve in a week or if worsening     I discussed the assessment and treatment plan with the patient. The patient was provided an opportunity to ask questions and all were answered. The patient agreed with the plan and demonstrated an understanding of the instructions.   The patient was advised to call back or seek an in-person evaluation if the symptoms worsen or if the condition fails to improve as anticipated.    Loura Pardon,  MD

## 2022-04-03 LAB — HM DEXA SCAN

## 2022-05-08 ENCOUNTER — Encounter: Payer: Self-pay | Admitting: Nurse Practitioner

## 2022-05-08 ENCOUNTER — Telehealth (INDEPENDENT_AMBULATORY_CARE_PROVIDER_SITE_OTHER): Payer: 59 | Admitting: Nurse Practitioner

## 2022-05-08 DIAGNOSIS — J069 Acute upper respiratory infection, unspecified: Secondary | ICD-10-CM | POA: Insufficient documentation

## 2022-05-08 MED ORDER — DOXYCYCLINE HYCLATE 100 MG PO TABS: 100.0000 mg | ORAL_TABLET | Freq: Two times a day (BID) | ORAL | 0 refills | Status: AC

## 2022-05-08 NOTE — Assessment & Plan Note (Signed)
Given length of illness and atypical presentation will treat with doxycycline 100 mg twice daily for 7 days.  Did offer to send patient in some steroids as she does endorse some chest tightness.  Patient politely declined as she states she nothing is that serious yet she will reach out if she feels like she can benefit from the steroid use.  Signs and symptoms reviewed when to seek urgent or emergent healthcare.  Patient will follow-up if no improvement.

## 2022-05-08 NOTE — Progress Notes (Signed)
Patient ID: Deborah Orozco, female    DOB: 10-31-1955, 66 y.o.   MRN: [id number]  Virtual visit completed through [employer], a video enabled telemedicine application. Due to national recommendations of social distancing due to COVID-19, a virtual visit is felt to be most appropriate for this patient at this time. Reviewed limitations, risks, security and privacy concerns of performing a virtual visit and the availability of in person appointments. I also reviewed that there may be a patient responsible charge related to this service. The patient agreed to proceed.   Patient location: home Provider location: [place] [facility], office Persons participating in this virtual visit: patient, provider   If any vitals were documented, they were collected by patient at home unless specified below.    There were no vitals taken for this visit.   CC: Respiratory symptoms  Subjective:   HPI: Deborah Orozco is a 66 y.o. female presenting on 05/08/2022 for head congestion (Started about 1 months ago, started with a sore throat for a day or so, laryngitis for a week, now sx settled in her chest-cough but not coughing up much, chest tightness, some chills/sweats off and on. )    Symptoms started approx 1 month ago States that her grand babies were sick and all under 7 years of age Did Covid test 3 times at home and was negative States that she has been taking Advil cold and sinus. States that she delsym, but caused her dizziness  Covid vaccines and boosters   relevant past medical, surgical, family and social history reviewed and updated as indicated. Interim medical history since our last visit reviewed. Allergies and medications reviewed and updated. Outpatient Medications Prior to Visit  Medication Sig Dispense Refill   Cholecalciferol (VITAMIN D-3) 5000 units TABS Take 1 capsule by mouth daily.     lisinopril (ZESTRIL) 10 MG tablet Take 1 tablet (10 mg total) by mouth daily. 90  tablet 3   Multiple Vitamin (MULTIVITAMIN) capsule Take by mouth. HERBALIFE     Probiotic Product (PROBIOTIC PO) Take 1 capsule by mouth every morning.     amoxicillin-clavulanate (AUGMENTIN) 875-125 MG tablet Take 1 tablet by mouth 2 (two) times daily. 14 tablet 0   Calcium Carb-Cholecalciferol (CALCIUM 1000 + D) 1000-800 MG-UNIT TABS calcium     No facility-administered medications prior to visit.     Per HPI unless specifically indicated in ROS section below Review of Systems  Constitutional:  Positive for appetite change, chills and fatigue. Negative for fever.       Fluid intake normal tea and water  HENT:  Positive for ear pain. Negative for ear discharge, sinus pressure, sinus pain and sore throat.   Respiratory:  Positive for cough and chest tightness. Negative for shortness of breath.   Gastrointestinal:  Negative for abdominal pain, constipation, diarrhea and nausea.  Musculoskeletal:  Negative for arthralgias and myalgias.  Neurological:  Positive for headaches.   Objective:  There were no vitals taken for this visit.  Wt Readings from Last 3 Encounters:  09/05/21 182 lb (82.6 kg)  07/25/21 184 lb (83.5 kg)  09/22/20 188 lb (85.3 kg)      Physical exam: Gen: alert, NAD, not ill appearing Pulm: speaks in complete sentences without increased work of breathing Psych: normal mood, normal thought content      Results for orders placed or performed in visit on 07/25/21  VITAMIN D 25 Hydroxy (Vit-D Deficiency, Fractures)  Result Value Ref Range   VITD 71.62 30.00 -  100.00 ng/mL  TSH  Result Value Ref Range   TSH 2.29 0.35 - 5.50 uIU/mL  Lipid panel  Result Value Ref Range   Cholesterol 217 (H) 0 - 200 mg/dL   Triglycerides 111.0 0.0 - 149.0 mg/dL   HDL 83.20 >39.00 mg/dL   VLDL 22.2 0.0 - 40.0 mg/dL   LDL Cholesterol 112 (H) 0 - 99 mg/dL   Total CHOL/HDL Ratio 3    NonHDL 134.11   Hemoglobin A1c  Result Value Ref Range   Hgb A1c MFr Bld 5.2 4.6 - 6.5 %   Comprehensive metabolic panel  Result Value Ref Range   Sodium 140 135 - 145 mEq/L   Potassium 4.4 3.5 - 5.1 mEq/L   Chloride 105 96 - 112 mEq/L   CO2 27 19 - 32 mEq/L   Glucose, Bld 85 70 - 99 mg/dL   BUN 16 6 - 23 mg/dL   Creatinine, Ser 0.76 0.40 - 1.20 mg/dL   Total Bilirubin 0.8 0.2 - 1.2 mg/dL   Alkaline Phosphatase 82 39 - 117 U/L   AST 20 0 - 37 U/L   ALT 23 0 - 35 U/L   Total Protein 6.5 6.0 - 8.3 g/dL   Albumin 4.3 3.5 - 5.2 g/dL   GFR 83.31 >60.00 mL/min   Calcium 9.9 8.4 - 10.5 mg/dL  CBC with Differential/Platelet  Result Value Ref Range   WBC 6.6 4.0 - 10.5 K/uL   RBC 4.69 3.87 - 5.11 Mil/uL   Hemoglobin 13.6 12.0 - 15.0 g/dL   HCT 40.9 36.0 - 46.0 %   MCV 87.4 78.0 - 100.0 fl   MCHC 33.3 30.0 - 36.0 g/dL   RDW 12.8 11.5 - 15.5 %   Platelets 236.0 150.0 - 400.0 K/uL   Neutrophils Relative % 66.3 43.0 - 77.0 %   Lymphocytes Relative 21.6 12.0 - 46.0 %   Monocytes Relative 6.3 3.0 - 12.0 %   Eosinophils Relative 5.0 0.0 - 5.0 %   Basophils Relative 0.8 0.0 - 3.0 %   Neutro Abs 4.4 1.4 - 7.7 K/uL   Lymphs Abs 1.4 0.7 - 4.0 K/uL   Monocytes Absolute 0.4 0.1 - 1.0 K/uL   Eosinophils Absolute 0.3 0.0 - 0.7 K/uL   Basophils Absolute 0.1 0.0 - 0.1 K/uL   Assessment & Plan:   Problem List Items Addressed This Visit       Respiratory   Upper respiratory tract infection - Primary    Given length of illness and atypical presentation will treat with doxycycline 100 mg twice daily for 7 days.  Did offer to send patient in some steroids as she does endorse some chest tightness.  Patient politely declined as she states she nothing is that serious yet she will reach out if she feels like she can benefit from the steroid use.  Signs and symptoms reviewed when to seek urgent or emergent healthcare.  Patient will follow-up if no improvement.      Relevant Medications   doxycycline (VIBRA-TABS) 100 MG tablet     Meds ordered this encounter  Medications   doxycycline  (VIBRA-TABS) 100 MG tablet    Sig: Take 1 tablet (100 mg total) by mouth 2 (two) times daily for 7 days.    Dispense:  14 tablet    Refill:  0    Order Specific Question:   Supervising Provider    Answer:   TOWER, MARNE A [[id number]]   No orders of the defined types were placed  in this encounter.   I discussed the assessment and treatment plan with the patient. The patient was provided an opportunity to ask questions and all were answered. The patient agreed with the plan and demonstrated an understanding of the instructions. The patient was advised to call back or seek an in-person evaluation if the symptoms worsen or if the condition fails to improve as anticipated.  Follow up plan: Return if symptoms worsen or fail to improve.  Romilda Garret, NP

## 2022-05-08 NOTE — Patient Instructions (Signed)
Nice to see you today I have sent in an antibiotic to the pharmacy Follow up if you do not start improving

## 2022-06-03 ENCOUNTER — Other Ambulatory Visit: Payer: Self-pay | Admitting: Family Medicine

## 2022-06-30 ENCOUNTER — Other Ambulatory Visit: Payer: Self-pay | Admitting: Family Medicine

## 2022-07-29 ENCOUNTER — Telehealth: Payer: Self-pay | Admitting: Family Medicine

## 2022-07-29 DIAGNOSIS — I10 Essential (primary) hypertension: Secondary | ICD-10-CM

## 2022-07-29 DIAGNOSIS — E559 Vitamin D deficiency, unspecified: Secondary | ICD-10-CM

## 2022-07-29 DIAGNOSIS — R7309 Other abnormal glucose: Secondary | ICD-10-CM

## 2022-07-29 NOTE — Telephone Encounter (Signed)
-----   Message from Velna Hatchet, RT sent at 07/17/2022 11:39 AM EST ----- Regarding: Mon 1/8 lab Patient is scheduled for cpx, please order future labs.  Thanks, Anda Kraft

## 2022-07-30 ENCOUNTER — Other Ambulatory Visit: Payer: No Typology Code available for payment source

## 2022-08-03 ENCOUNTER — Encounter: Payer: No Typology Code available for payment source | Admitting: Family Medicine

## 2022-08-09 ENCOUNTER — Other Ambulatory Visit: Payer: No Typology Code available for payment source

## 2022-08-20 ENCOUNTER — Encounter: Payer: No Typology Code available for payment source | Admitting: Family Medicine

## 2022-08-30 ENCOUNTER — Other Ambulatory Visit: Payer: No Typology Code available for payment source

## 2022-09-06 ENCOUNTER — Encounter: Payer: No Typology Code available for payment source | Admitting: Family Medicine

## 2022-09-07 ENCOUNTER — Other Ambulatory Visit: Payer: No Typology Code available for payment source

## 2022-09-13 ENCOUNTER — Encounter: Payer: No Typology Code available for payment source | Admitting: Family Medicine

## 2022-10-03 ENCOUNTER — Other Ambulatory Visit: Payer: Self-pay | Admitting: Family Medicine

## 2022-10-09 ENCOUNTER — Other Ambulatory Visit: Payer: No Typology Code available for payment source

## 2022-10-16 ENCOUNTER — Encounter: Payer: No Typology Code available for payment source | Admitting: Family Medicine

## 2022-10-29 ENCOUNTER — Other Ambulatory Visit: Payer: 59

## 2022-10-29 ENCOUNTER — Other Ambulatory Visit: Payer: Self-pay | Admitting: Family Medicine

## 2022-10-29 NOTE — Telephone Encounter (Signed)
Patient cancelled appointment for today. Has CPE rescheduled for 11/15/22. Looks like last few times has cancelled appointment. Ok to fill?

## 2022-10-30 ENCOUNTER — Other Ambulatory Visit: Payer: No Typology Code available for payment source

## 2022-10-31 ENCOUNTER — Other Ambulatory Visit (INDEPENDENT_AMBULATORY_CARE_PROVIDER_SITE_OTHER): Payer: 59

## 2022-10-31 DIAGNOSIS — I10 Essential (primary) hypertension: Secondary | ICD-10-CM

## 2022-10-31 DIAGNOSIS — R7309 Other abnormal glucose: Secondary | ICD-10-CM | POA: Diagnosis not present

## 2022-10-31 DIAGNOSIS — E559 Vitamin D deficiency, unspecified: Secondary | ICD-10-CM

## 2022-10-31 LAB — COMPREHENSIVE METABOLIC PANEL
ALT: 16 U/L (ref 0–35)
AST: 18 U/L (ref 0–37)
Albumin: 4.5 g/dL (ref 3.5–5.2)
Alkaline Phosphatase: 80 U/L (ref 39–117)
BUN: 19 mg/dL (ref 6–23)
CO2: 28 mEq/L (ref 19–32)
Calcium: 9.5 mg/dL (ref 8.4–10.5)
Chloride: 104 mEq/L (ref 96–112)
Creatinine, Ser: 0.84 mg/dL (ref 0.40–1.20)
GFR: 73.23 mL/min (ref >60.00)
Glucose, Bld: 87 mg/dL (ref 70–99)
Potassium: 3.9 mEq/L (ref 3.5–5.1)
Sodium: 141 mEq/L (ref 135–145)
Total Bilirubin: 0.8 mg/dL (ref 0.2–1.2)
Total Protein: 6.7 g/dL (ref 6.0–8.3)

## 2022-10-31 LAB — CBC WITH DIFFERENTIAL/PLATELET
Basophils Absolute: 0.1 10*3/uL (ref 0.0–0.1)
Basophils Relative: 1.3 % (ref 0.0–3.0)
Eosinophils Absolute: 0.3 10*3/uL (ref 0.0–0.7)
Eosinophils Relative: 5.2 % — ABNORMAL HIGH (ref 0.0–5.0)
HCT: 42.3 % (ref 36.0–46.0)
Hemoglobin: 14 g/dL (ref 12.0–15.0)
Lymphocytes Relative: 23.4 % (ref 12.0–46.0)
Lymphs Abs: 1.3 10*3/uL (ref 0.7–4.0)
MCHC: 33.2 g/dL (ref 30.0–36.0)
MCV: 88.4 fl (ref 78.0–100.0)
Monocytes Absolute: 0.4 10*3/uL (ref 0.1–1.0)
Monocytes Relative: 7.7 % (ref 3.0–12.0)
Neutro Abs: 3.5 10*3/uL (ref 1.4–7.7)
Neutrophils Relative %: 62.4 % (ref 43.0–77.0)
Platelets: 244 10*3/uL (ref 150.0–400.0)
RBC: 4.79 Mil/uL (ref 3.87–5.11)
RDW: 13.4 % (ref 11.5–15.5)
WBC: 5.5 10*3/uL (ref 4.0–10.5)

## 2022-10-31 LAB — LIPID PANEL
Cholesterol: 206 mg/dL — ABNORMAL HIGH (ref 0–200)
HDL: 79.7 mg/dL (ref >39.00)
LDL Cholesterol: 109 mg/dL — ABNORMAL HIGH (ref 0–99)
NonHDL: 126.19
Total CHOL/HDL Ratio: 3
Triglycerides: 87 mg/dL (ref 0.0–149.0)
VLDL: 17.4 mg/dL (ref 0.0–40.0)

## 2022-10-31 LAB — HEMOGLOBIN A1C: Hgb A1c MFr Bld: 5.2 % (ref 4.6–6.5)

## 2022-10-31 LAB — TSH: TSH: 2.8 u[IU]/mL (ref 0.35–5.50)

## 2022-10-31 LAB — VITAMIN D 25 HYDROXY (VIT D DEFICIENCY, FRACTURES): VITD: 69.19 ng/mL (ref 30.00–100.00)

## 2022-11-15 ENCOUNTER — Encounter: Payer: Self-pay | Admitting: Family Medicine

## 2022-11-15 ENCOUNTER — Ambulatory Visit (INDEPENDENT_AMBULATORY_CARE_PROVIDER_SITE_OTHER): Payer: 59 | Admitting: Family Medicine

## 2022-11-15 VITALS — BP 134/80 | HR 79 | Temp 98.0°F | Ht 67.0 in | Wt 188.4 lb

## 2022-11-15 DIAGNOSIS — I10 Essential (primary) hypertension: Secondary | ICD-10-CM

## 2022-11-15 DIAGNOSIS — Z803 Family history of malignant neoplasm of breast: Secondary | ICD-10-CM

## 2022-11-15 DIAGNOSIS — E559 Vitamin D deficiency, unspecified: Secondary | ICD-10-CM | POA: Diagnosis not present

## 2022-11-15 DIAGNOSIS — Z0000 Encounter for general adult medical examination without abnormal findings: Secondary | ICD-10-CM | POA: Diagnosis not present

## 2022-11-15 DIAGNOSIS — Z1211 Encounter for screening for malignant neoplasm of colon: Secondary | ICD-10-CM | POA: Diagnosis not present

## 2022-11-15 DIAGNOSIS — M85859 Other specified disorders of bone density and structure, unspecified thigh: Secondary | ICD-10-CM | POA: Diagnosis not present

## 2022-11-15 DIAGNOSIS — R7309 Other abnormal glucose: Secondary | ICD-10-CM

## 2022-11-15 NOTE — Patient Instructions (Addendum)
Keep walking   Add some strength training to your routine, this is important for bone and brain health and can reduce your risk of falls and help your body use insulin properly and regulate weight  Light weights, exercise bands , and internet videos are a good way to start  Yoga (chair or regular), machines , floor exercises or a gym with machines are also good options    Try to get most of your carbohydrates from produce (with the exception of white potatoes)  Eat less bread/pasta/rice/snack foods/cereals/sweets and other items from the middle of the grocery store (processed carbs)  Think about an appt with Dr Patsy Lager when you can for the hip / buttock/back pain    Take care of yourself

## 2022-11-15 NOTE — Assessment & Plan Note (Signed)
Reviewed health habits including diet and exercise and skin cancer prevention Reviewed appropriate screening tests for age  Also reviewed health mt list, fam hx and immunization status , as well as social and family history   See HPI Labs reviewed and ordered Mammogram report from gyn sent for  Gets mri as well for breast ca screen in light of fam hx  Pap utd 08/2017 from gyn nl with neg HPV Colonoscopy utd 2021 with 5 y recall Dexa 2019- reviewed/ sent for more recent one from gyn / no falls or fx and D level is therapeutic Disc strength building exercise  Phq is 3    due to some recent change in appetite / no prob functioning

## 2022-11-15 NOTE — Assessment & Plan Note (Signed)
Gets yearly mammograms  Also supplemental mri  Reviewed   Sees gyn for this

## 2022-11-15 NOTE — Progress Notes (Signed)
Subjective:    Patient ID: Deborah Orozco, female    DOB: 10/31/1955, 67 y.o.   MRN: [id number]  HPI Here for health maintenance exam and to review chronic medical problems   Wt Readings from Last 3 Encounters:  11/15/22 188 lb 6 oz (85.4 kg)  09/05/21 182 lb (82.6 kg)  07/25/21 184 lb (83.5 kg)   29.50 kg/m Vitals:   11/15/22 0909  BP: 134/80  Pulse: 79  Temp: 98 F (36.7 C)  SpO2: 100%   Struggling with weight  Eating less and walking more  Keeps going up     Immunization History  Administered Date(s) Administered   Influenza Inj Mdck Quad Pf 06/21/2021, 07/08/2022   Influenza,inj,Quad PF,6+ Mos 06/04/2013, 07/13/2014, 07/05/2015, 08/28/2016, 04/01/2017, 04/24/2018, 04/10/2019, 06/27/2020   PFIZER(Purple Top)SARS-COV-2 Vaccination 08/30/2019, 09/23/2019, 06/06/2020   Pfizer Covid-19 Vaccine Bivalent Booster 52yrs & up 06/21/2021   Td 03/14/2010   Tdap 02/28/2018   Zoster Recombinat (Shingrix) 10/16/2020, 01/13/2021   Health Maintenance Due  Topic Date Due   HIV Screening  Never done   Hepatitis C Screening  Never done   MAMMOGRAM  08/10/2019   PAP SMEAR-Modifier  09/13/2020   COVID-19 Vaccine (5 - 2023-24 season) 03/23/2022   Feeling pretty good overall   [facility] 07/2017 - [facility]  Gets MRI every 6 months as well  06/2021  Self breast exam : no lumps  Mother had breast cancer   [facility] care -[facility]   Pap 08/2017 nl with neg HPV   Colonoscopy 05/2020 with 5 y recall Family h/o colon cancer   Dexa 2019  Osteopenia  Followed by gyn  Falls: none  Fractures:none  Supplements  Last vitamin D Lab Results  Component Value Date   VD25OH 69.19 10/31/2022  Takes 5000 iu daily  Calcium in diet   Exercise : walking a lot / up to 10,000 steps per day    Mood    11/15/2022    9:14 AM 07/25/2021   11:32 AM 06/27/2020    9:34 AM 04/10/2019    9:11 AM 02/28/2018    8:59 AM  Depression screen PHQ 2/9  Decreased  Interest 0 0 0 0 1  Down, Depressed, Hopeless 0 0 0 0 1  PHQ - 2 Score 0 0 0 0 2  Altered sleeping 0 1 1 1 2   Tired, decreased energy 1 1 1  0 3  Change in appetite 2 0 0 0 2  Feeling bad or failure about yourself  0 0 0 0 1  Trouble concentrating 0 0 0 0 1  Moving slowly or fidgety/restless 0 0 0 0 0  Suicidal thoughts 0 0 0 0 0  PHQ-9 Score 3 2 2 1 11   Difficult doing work/chores Not difficult at all Not difficult at all Not difficult at all Not difficult at all       HTN bp is stable today  No cp or palpitations or headaches or edema  No side effects to medicines  BP Readings from Last 3 Encounters:  11/15/22 134/80  07/25/21 131/80  09/22/20 120/82    Pulse Readings from Last 3 Encounters:  11/15/22 79  07/25/21 74  09/22/20 87     Lisinopril 10 mg daily   Last metabolic panel Lab Results  Component Value Date   GLUCOSE 87 10/31/2022   NA 141 10/31/2022   K 3.9 10/31/2022   CL 104 10/31/2022   CO2 28 10/31/2022  BUN 19 10/31/2022   CREATININE 0.84 10/31/2022   CALCIUM 9.5 10/31/2022   PROT 6.7 10/31/2022   ALBUMIN 4.5 10/31/2022   BILITOT 0.8 10/31/2022   ALKPHOS 80 10/31/2022   AST 18 10/31/2022   ALT 16 10/31/2022   GFR of 73.2   Cholesterol Lab Results  Component Value Date   CHOL 206 (H) 10/31/2022   CHOL 217 (H) 07/25/2021   CHOL 193 06/23/2020   Lab Results  Component Value Date   HDL 79.70 10/31/2022   HDL 83.20 07/25/2021   HDL 80.80 06/23/2020   Lab Results  Component Value Date   LDLCALC 109 (H) 10/31/2022   LDLCALC 112 (H) 07/25/2021   LDLCALC 96 06/23/2020   Lab Results  Component Value Date   TRIG 87.0 10/31/2022   TRIG 111.0 07/25/2021   TRIG 82.0 06/23/2020   Lab Results  Component Value Date   CHOLHDL 3 10/31/2022   CHOLHDL 3 07/25/2021   CHOLHDL 2 06/23/2020   Lab Results  Component Value Date   LDLDIRECT 133.1 03/14/2010   Is improved LDL and trig  Eating better   Glucose Lab Results  Component Value  Date   HGBA1C 5.2 10/31/2022  This is stable   Strong fam h/o DM    Other labs Lab Results  Component Value Date   TSH 2.80 10/31/2022   Lab Results  Component Value Date   WBC 5.5 10/31/2022   HGB 14.0 10/31/2022   HCT 42.3 10/31/2022   MCV 88.4 10/31/2022   PLT 244.0 10/31/2022   Some right hip/ ? Low back problems on the R  ? 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   Colon cancer screening 07/09/2012   Breast screening, unspecified 04/08/2012   Routine general medical examination at a health care facility 12/20/2011   Vitamin D deficiency 03/14/2010   Past Medical History:  Diagnosis Date   Hypertension    Post-menopausal    Sleep apnea    use cpap   Past Surgical History:  Procedure Laterality Date   COLONOSCOPY  2015   Social History   Tobacco Use   Smoking status: Never   Smokeless tobacco: Never  Vaping Use   Vaping Use: Never used  Substance Use Topics   Alcohol use: Yes    Alcohol/week: 2.0 standard drinks of alcohol    Types: 2 Glasses of wine per week    Comment: occ   Drug use: No   Family History  Problem Relation Age of Onset   Cancer Mother 19       Breast twice recurred at 46/colon/liver   Diabetes Mother    Hypertension Mother    Colon cancer Mother 28   Hypertension Father    Heart disease Brother    Colon polyps Brother    Cancer Maternal Grandfather        possible colon cancer unsure of exact location gastro/tn   Stomach cancer Maternal Grandmother    Esophageal cancer Neg Hx    Rectal  cancer Neg Hx    Allergies  Allergen Reactions   Amlodipine     dizzy   Current Outpatient Medications on File Prior to Visit  Medication Sig Dispense Refill   Cholecalciferol (VITAMIN D-3) 5000 units TABS Take 1 capsule by mouth daily.     lisinopril (ZESTRIL) 10 MG tablet TAKE 1 TABLET BY MOUTH EVERY DAY 90 tablet 0   Multiple Vitamin (MULTIVITAMIN) capsule Take by mouth. HERBALIFE     Probiotic Product (PROBIOTIC PO) Take 1 capsule by mouth every morning.     No current facility-administered medications on file prior to visit.     Review of Systems  Constitutional:  Positive for appetite change and fatigue. Negative for activity change, fever and unexpected weight change.  HENT:  Negative for congestion, ear pain, rhinorrhea, sinus pressure and sore throat.   Eyes:  Negative for pain, redness and visual disturbance.  Respiratory:  Negative for cough, shortness of breath and wheezing.   Cardiovascular:  Negative for chest pain and palpitations.  Gastrointestinal:  Negative for abdominal pain, blood in stool, constipation and diarrhea.  Endocrine: Negative for polydipsia and polyuria.  Genitourinary:  Negative for dysuria, frequency and urgency.  Musculoskeletal:  Negative for arthralgias, back pain and myalgias.  Skin:  Negative for pallor and rash.  Allergic/Immunologic: Negative for environmental allergies.  Neurological:  Negative for dizziness, syncope and headaches.  Hematological:  Negative for adenopathy. Does not bruise/bleed easily.  Psychiatric/Behavioral:  Negative for decreased concentration and dysphoric mood. The patient is not nervous/anxious.        Objective:   Physical Exam Constitutional:      General: She is not in acute distress.    Appearance: Normal appearance. She is well-developed. She is not ill-appearing or diaphoretic.     Comments: Overweight   HENT:     Head: Normocephalic and atraumatic.     Right Ear: Tympanic membrane, ear canal and  external ear normal.     Left Ear: Tympanic membrane, ear canal and external ear normal.     Nose: Nose normal. No congestion.     Mouth/Throat:     Mouth: Mucous membranes are moist.     Pharynx: Oropharynx is clear. No posterior oropharyngeal erythema.  Eyes:     General: No scleral icterus.    Extraocular Movements: Extraocular movements intact.     Conjunctiva/sclera: Conjunctivae normal.     Pupils: Pupils are equal, round, and reactive to light.  Neck:     Thyroid: No thyromegaly.     Vascular: No carotid bruit or JVD.  Cardiovascular:     Rate and Rhythm: Normal rate and regular rhythm.     Pulses: Normal pulses.     Heart sounds: Normal heart sounds.     No gallop.  Pulmonary:     Effort: Pulmonary effort is normal. No respiratory distress.     Breath sounds: Normal breath sounds. No wheezing.     Comments: Good air exch Chest:     Chest wall: No tenderness.  Abdominal:     General: Bowel sounds are normal. There is no distension or abdominal bruit.     Palpations: Abdomen is soft. There is no mass.     Tenderness: There is no abdominal tenderness.     Hernia: No hernia is present.  Genitourinary:    Comments: Breast and pelvic exam are done by gyn  Musculoskeletal:        General: No tenderness. Normal range of motion.     Cervical back: Normal range of motion and  neck supple. No rigidity. No muscular tenderness.     Right lower leg: No edema.     Left lower leg: No edema.     Comments: No kyphosis   Lymphadenopathy:     Cervical: No cervical adenopathy.  Skin:    General: Skin is warm and dry.     Coloration: Skin is not pale.     Findings: No erythema or rash.     Comments: Solar lentigines diffusely   Neurological:     Mental Status: She is alert. Mental status is at baseline.     Cranial Nerves: No cranial nerve deficit.     Motor: No abnormal muscle tone.     Coordination: Coordination normal.     Gait: Gait normal.     Deep Tendon Reflexes: Reflexes  are normal and symmetric. Reflexes normal.  Psychiatric:        Mood and Affect: Mood normal.        Cognition and Memory: Cognition and memory normal.           Assessment & Plan:   Problem List Items Addressed This Visit       Cardiovascular and Mediastinum   Hypertension, essential    bp in fair control at this time  BP Readings from Last 1 Encounters:  11/15/22 134/80  No changes needed Most recent labs reviewed  Disc lifstyle change with low sodium diet and exercise  Plan to continue lisinopril 10 mg daily        Musculoskeletal and Integument   Osteopenia    Dexa noted 2019- may have had another, sent for from gyn  No falls or fx  D level is tx Disc strength building exercise         Other   Colon cancer screening    Colonscopy utd 2021 wit 5 y recall      Elevated random blood glucose level    Lab Results  Component Value Date   HGBA1C 5.2 10/31/2022  disc imp of low glycemic diet and wt loss to prevent DM2        Family history of breast cancer in mother    Gets yearly mammograms  Also supplemental mri  Reviewed   Sees gyn for this       Routine general medical examination at a health care facility - Primary    Reviewed health habits including diet and exercise and skin cancer prevention Reviewed appropriate screening tests for age  Also reviewed health mt list, fam hx and immunization status , as well as social and family history   See HPI Labs reviewed and ordered Mammogram report from gyn sent for  [employer] as well for breast ca screen in light of fam hx  Pap utd 08/2017 from gyn nl with neg HPV Colonoscopy utd 2021 with 5 y recall Dexa 2019- reviewed/ sent for more recent one from gyn / no falls or fx and D level is therapeutic Disc strength building exercise  Phq is 3    due to some recent change in appetite / no prob functioning       Vitamin D deficiency    Vitamin D level is therapeutic with current supplementation Disc  importance of this to bone and overall health  Last vitamin D Lab Results  Component Value Date   VD25OH 69.19 10/31/2022

## 2022-11-15 NOTE — Assessment & Plan Note (Signed)
Colonscopy utd 2021 wit 5 y recall

## 2022-11-15 NOTE — Assessment & Plan Note (Signed)
Gets both mri and mam due to her fam hx Sees gyn

## 2022-11-15 NOTE — Assessment & Plan Note (Signed)
Lab Results  Component Value Date   HGBA1C 5.2 10/31/2022   disc imp of low glycemic diet and wt loss to prevent DM2

## 2022-11-15 NOTE — Assessment & Plan Note (Signed)
Vitamin D level is therapeutic with current supplementation Disc importance of this to bone and overall health  Last vitamin D Lab Results  Component Value Date   VD25OH 69.19 10/31/2022

## 2022-11-15 NOTE — Assessment & Plan Note (Signed)
Dexa noted 2019- may have had another, sent for from gyn  No falls or fx  D level is tx Disc strength building exercise

## 2022-11-15 NOTE — Assessment & Plan Note (Signed)
bp in fair control at this time  BP Readings from Last 1 Encounters:  11/15/22 134/80   No changes needed Most recent labs reviewed  Disc lifstyle change with low sodium diet and exercise  Plan to continue lisinopril 10 mg daily

## 2023-01-19 ENCOUNTER — Other Ambulatory Visit: Payer: Self-pay | Admitting: Family Medicine

## 2023-03-01 ENCOUNTER — Encounter: Payer: Self-pay | Admitting: Nurse Practitioner

## 2023-03-01 ENCOUNTER — Ambulatory Visit: Payer: 59 | Admitting: Nurse Practitioner

## 2023-03-01 VITALS — BP 128/88 | HR 88 | Temp 98.2°F | Ht 67.0 in | Wt 182.8 lb

## 2023-03-01 DIAGNOSIS — R051 Acute cough: Secondary | ICD-10-CM | POA: Diagnosis not present

## 2023-03-01 DIAGNOSIS — R062 Wheezing: Secondary | ICD-10-CM | POA: Insufficient documentation

## 2023-03-01 DIAGNOSIS — B349 Viral infection, unspecified: Secondary | ICD-10-CM | POA: Insufficient documentation

## 2023-03-01 MED ORDER — ALBUTEROL SULFATE HFA 108 (90 BASE) MCG/ACT IN AERS: 2.0000 | INHALATION_SPRAY | Freq: Four times a day (QID) | RESPIRATORY_TRACT | 0 refills | Status: DC | PRN

## 2023-03-01 MED ORDER — BENZONATATE 100 MG PO CAPS: 100.0000 mg | ORAL_CAPSULE | Freq: Three times a day (TID) | ORAL | 0 refills | Status: DC | PRN

## 2023-03-01 NOTE — Assessment & Plan Note (Addendum)
Patient cursing viral syndrome syndrome with headache body aches chills fatigue already started improving rest drink plenty fluids over-the-counter analgesics as needed follow-up if no improvement.  COVID test negative in office

## 2023-03-01 NOTE — Assessment & Plan Note (Signed)
COVID test in office.  Patient is doing well with NyQuil send Tessalon Perles 100 mg 3 times daily as needed

## 2023-03-01 NOTE — Progress Notes (Signed)
Acute Office Visit  Subjective:     Patient ID: Deborah Orozco, female    DOB: 04-24-1956, 67 y.o.   MRN: [id number]  Chief Complaint  Patient presents with   Chills    Started Wednesday. Cold symptoms, body aches. Headache, ear aches, and cough and congestion in chest. Pt states of wheezing and hurts to take a breath.  Pt states fever has improved. Does not feel chills today. Took COVID test yesterday (neg)     HPI Patient is in today for sick symptoms with a history of HTN, osteopenia, Vitamin D def,    Covid vaccines: 2 and 2 boosters Flu vaccine: UTD States husband was feeling poor on Tuesday  No other sick contacts States she has tried nyquill and aleve With relief    Review of Systems  Constitutional:  Positive for chills and malaise/fatigue. Negative for fever.  HENT:  Positive for ear pain and sore throat (improving).   Respiratory:  Positive for wheezing.   Cardiovascular:  Positive for chest pain (with deep breath).  Gastrointestinal:  Negative for abdominal pain, diarrhea, nausea and vomiting.  Musculoskeletal:  Positive for back pain and myalgias.  Neurological:  Positive for headaches.        Objective:    BP 128/88   Pulse 88   Temp 98.2 F (36.8 C) (Temporal)   Ht 5\' 7"  (1.702 m)   Wt 182 lb 12.8 oz (82.9 kg)   SpO2 97%   BMI 28.63 kg/m    Physical Exam Vitals and nursing note reviewed.  Constitutional:      Appearance: Normal appearance.  HENT:     Right Ear: Tympanic membrane, ear canal and external ear normal.     Left Ear: Tympanic membrane, ear canal and external ear normal.     Mouth/Throat:     Mouth: Mucous membranes are moist.     Pharynx: No posterior oropharyngeal erythema.  Cardiovascular:     Rate and Rhythm: Normal rate and regular rhythm.     Heart sounds: Normal heart sounds.  Pulmonary:     Effort: Pulmonary effort is normal.     Breath sounds: Normal breath sounds.  Lymphadenopathy:     Cervical: No cervical  adenopathy.  Neurological:     Mental Status: She is alert.     No results found for any visits on 03/01/23.      Assessment & Plan:   Problem List Items Addressed This Visit       Other   Acute cough    COVID test in office.  Patient is doing well with NyQuil send Tessalon Perles 100 mg 3 times daily as needed      Relevant Medications   benzonatate (TESSALON) 100 MG capsule   Viral syndrome - Primary    Patient cursing viral syndrome syndrome with headache body aches chills fatigue already started improving rest drink plenty fluids over-the-counter analgesics as needed follow-up if no improvement.  COVID test negative in office      Wheeze    Patient has subjective wheeze.  Will send in albuterol inhaler if needed lungs clear to auscultation today      Relevant Medications   albuterol (VENTOLIN HFA) 108 (90 Base) MCG/ACT inhaler    Meds ordered this encounter  Medications   albuterol (VENTOLIN HFA) 108 (90 Base) MCG/ACT inhaler    Sig: Inhale 2 puffs into the lungs every 6 (six) hours as needed for wheezing or shortness of breath.    Dispense:  8 g    Refill:  0    Order Specific Question:   Supervising Provider    Answer:   Roxy Manns A [[id number]]   benzonatate (TESSALON) 100 MG capsule    Sig: Take 1 capsule (100 mg total) by mouth 3 (three) times daily as needed.    Dispense:  21 capsule    Refill:  0    Order Specific Question:   Supervising Provider    Answer:   TOWER, MARNE A [[id number]]    Return if symptoms worsen or fail to improve.  Audria Nine, NP

## 2023-03-01 NOTE — Patient Instructions (Signed)
Nice to see you today Covid test was negative in office I do feel this is viral in nature and will self resolve Let me know by phone call or mychart message if you do not continue to improve

## 2023-03-01 NOTE — Assessment & Plan Note (Signed)
Patient has subjective wheeze.  Will send in albuterol inhaler if needed lungs clear to auscultation today

## 2023-03-05 ENCOUNTER — Telehealth: Payer: Self-pay | Admitting: Family Medicine

## 2023-03-05 MED ORDER — AZITHROMYCIN 250 MG PO TABS: ORAL_TABLET | ORAL | 0 refills | Status: AC

## 2023-03-05 NOTE — Telephone Encounter (Signed)
Pt called in stated still not felling well symptoms has not improve. Would like to know next steps . [phone]

## 2023-03-05 NOTE — Telephone Encounter (Signed)
Called pt to inform of antibiotic sent in. Stated that if pt symptoms do not improve to call and schedule an appointment.

## 2023-03-05 NOTE — Telephone Encounter (Signed)
I will send in a ZPak antibiotic if she does not improve with that she will need to be seen in office again

## 2023-03-15 NOTE — Telephone Encounter (Signed)
Thanks for seeing hre !

## 2023-03-15 NOTE — Telephone Encounter (Signed)
Received a refill request for Zpack. Called and spoke with pt, she requested refill because her congestion is not all the way gone and she is going on a trip the end of next week.  I explained that she would need a visit to re-assess her symptoms.  Appointment made with Audria Nine 03/18/23.

## 2023-03-15 NOTE — Telephone Encounter (Signed)
Agreed. Needs to be evaluated

## 2023-03-18 ENCOUNTER — Ambulatory Visit: Payer: 59 | Admitting: Nurse Practitioner

## 2023-03-18 ENCOUNTER — Encounter: Payer: Self-pay | Admitting: Nurse Practitioner

## 2023-03-18 VITALS — BP 108/70 | HR 74 | Temp 97.7°F | Ht 67.0 in | Wt 177.2 lb

## 2023-03-18 DIAGNOSIS — R058 Other specified cough: Secondary | ICD-10-CM | POA: Diagnosis not present

## 2023-03-18 DIAGNOSIS — R0981 Nasal congestion: Secondary | ICD-10-CM

## 2023-03-18 MED ORDER — PREDNISONE 20 MG PO TABS: 40.0000 mg | ORAL_TABLET | Freq: Every day | ORAL | 0 refills | Status: AC

## 2023-03-18 NOTE — Progress Notes (Signed)
Acute Office Visit  Subjective:     Patient ID: Deborah Orozco, female    DOB: 1956-01-15, 67 y.o.   MRN: [id number]  Chief Complaint  Patient presents with   Follow-up    On Cough and Congestion    HPI Patient is in today for sick symptoms with a history of htn, osteopenia, cataracts  Patient was seen by me on 03/01/2023 and dx with viral syndrome she reached out on 03/05/2023 without improvement in symptoms and she was written a zpak. Painet reached out again stating that she has some lingering congestion and wanted a refill on the zpak. She was asked to be revaluated and is here for that  History at last office visit was symptoms started on 02/26/2023 and was evaluated at 03/01/2023. State that it settled into her chest prior to startin gthe zpak. States that she is feeling a lot better. States that she is going to travel aboard on Wednesday. States that she is coughing and getting some production. States that she is getting some head congestion.  States that she tooka na Advil cold and sinus and it did not help   Review of Systems  Constitutional:  Negative for chills, fever and malaise/fatigue.  HENT:  Positive for congestion. Negative for ear discharge, ear pain and sore throat.   Respiratory:  Positive for cough and sputum production. Negative for shortness of breath.   Cardiovascular:  Negative for chest pain.  Gastrointestinal:  Negative for abdominal pain, diarrhea, nausea and vomiting.  Musculoskeletal:  Negative for back pain and myalgias.  Neurological:  Negative for headaches.        Objective:    BP 108/70 (BP Location: Left Arm, Patient Position: Sitting, Cuff Size: Normal)   Pulse 74   Temp 97.7 F (36.5 C) (Temporal)   Ht 5\' 7"  (1.702 m)   Wt 177 lb 4 oz (80.4 kg)   SpO2 97%   BMI 27.76 kg/m    Physical Exam Vitals and nursing note reviewed.  Constitutional:      Appearance: Normal appearance.  HENT:     Right Ear: Tympanic membrane, ear canal  and external ear normal.     Left Ear: Tympanic membrane, ear canal and external ear normal.     Nose:     Right Sinus: No maxillary sinus tenderness or frontal sinus tenderness.     Left Sinus: No maxillary sinus tenderness or frontal sinus tenderness.  Cardiovascular:     Rate and Rhythm: Normal rate and regular rhythm.     Heart sounds: Normal heart sounds.  Pulmonary:     Effort: Pulmonary effort is normal.     Breath sounds: Normal breath sounds.  Neurological:     Mental Status: She is alert.     No results found for any visits on 03/18/23.      Assessment & Plan:   Problem List Items Addressed This Visit       Respiratory   Congestion of nasal sinus - Primary    Patient treated with stasis over her chest to her sinuses do not think his infection at this juncture will write prednisone 40 mg daily for 4 days avoid NSAIDs take with food patient to get this filled and take this with her if she decides not to take it prior to leaving the country      Relevant Medications   predniSONE (DELTASONE) 20 MG tablet     Other   Productive cough    Improving will  treat with a Z-Pak.  Will write prednisone 40 mg daily for 4 days to help with symptomatic treatment.  Avoid NSAIDs      Relevant Medications   predniSONE (DELTASONE) 20 MG tablet    Meds ordered this encounter  Medications   predniSONE (DELTASONE) 20 MG tablet    Sig: Take 2 tablets (40 mg total) by mouth daily with breakfast for 4 days. Avoid ibuprofen/aleve    Dispense:  8 tablet    Refill:  0    Order Specific Question:   Supervising Provider    Answer:   TOWER, MARNE A [[id number]]    Return if symptoms worsen or fail to improve.  Audria Nine, NP

## 2023-03-18 NOTE — Patient Instructions (Signed)
Nice to see you today You can get the prednisone filled and take it with you. If you continue to improve that is great. If you need some additional relief then take the steroid Follow up if you do not improve.

## 2023-03-18 NOTE — Assessment & Plan Note (Signed)
Patient treated with stasis over her chest to her sinuses do not think his infection at this juncture will write prednisone 40 mg daily for 4 days avoid NSAIDs take with food patient to get this filled and take this with her if she decides not to take it prior to leaving the country

## 2023-03-18 NOTE — Assessment & Plan Note (Signed)
Improving will treat with a Z-Pak.  Will write prednisone 40 mg daily for 4 days to help with symptomatic treatment.  Avoid NSAIDs

## 2023-03-23 ENCOUNTER — Other Ambulatory Visit: Payer: Self-pay | Admitting: Nurse Practitioner

## 2023-03-23 DIAGNOSIS — R062 Wheezing: Secondary | ICD-10-CM

## 2023-05-28 ENCOUNTER — Other Ambulatory Visit: Payer: Self-pay | Admitting: Obstetrics and Gynecology

## 2023-05-28 DIAGNOSIS — R928 Other abnormal and inconclusive findings on diagnostic imaging of breast: Secondary | ICD-10-CM

## 2023-06-12 ENCOUNTER — Ambulatory Visit
Admission: RE | Admit: 2023-06-12 | Discharge: 2023-06-12 | Disposition: A | Discharge status: Home / Self Care | Payer: No Typology Code available for payment source | Source: Ambulatory Visit | Attending: Obstetrics and Gynecology | Admitting: Obstetrics and Gynecology

## 2023-06-12 ENCOUNTER — Ambulatory Visit
Admission: RE | Admit: 2023-06-12 | Discharge: 2023-06-12 | Disposition: A | Discharge status: Home / Self Care | Payer: 59 | Source: Ambulatory Visit | Attending: Obstetrics and Gynecology | Admitting: Obstetrics and Gynecology

## 2023-06-12 ENCOUNTER — Other Ambulatory Visit: Payer: Self-pay | Admitting: Obstetrics and Gynecology

## 2023-06-12 DIAGNOSIS — R928 Other abnormal and inconclusive findings on diagnostic imaging of breast: Secondary | ICD-10-CM

## 2023-06-12 DIAGNOSIS — N6489 Other specified disorders of breast: Secondary | ICD-10-CM

## 2023-06-18 ENCOUNTER — Ambulatory Visit
Admission: RE | Admit: 2023-06-18 | Discharge: 2023-06-18 | Disposition: A | Discharge status: Home / Self Care | Payer: 59 | Source: Ambulatory Visit | Attending: Obstetrics and Gynecology | Admitting: Obstetrics and Gynecology

## 2023-06-18 DIAGNOSIS — N6489 Other specified disorders of breast: Secondary | ICD-10-CM

## 2023-06-18 HISTORY — PX: BREAST BIOPSY: SHX20

## 2023-06-19 LAB — SURGICAL PATHOLOGY

## 2023-07-22 ENCOUNTER — Other Ambulatory Visit: Payer: Self-pay | Admitting: Family Medicine

## 2023-08-22 ENCOUNTER — Encounter: Payer: Self-pay | Admitting: Family Medicine

## 2023-08-22 ENCOUNTER — Ambulatory Visit: Payer: 59 | Admitting: Family Medicine

## 2023-08-22 VITALS — BP 132/80 | HR 103 | Temp 98.3°F | Ht 67.0 in | Wt 182.0 lb

## 2023-08-22 DIAGNOSIS — J019 Acute sinusitis, unspecified: Secondary | ICD-10-CM | POA: Insufficient documentation

## 2023-08-22 DIAGNOSIS — R430 Anosmia: Secondary | ICD-10-CM | POA: Diagnosis not present

## 2023-08-22 DIAGNOSIS — J0110 Acute frontal sinusitis, unspecified: Secondary | ICD-10-CM

## 2023-08-22 DIAGNOSIS — R1319 Other dysphagia: Secondary | ICD-10-CM | POA: Diagnosis not present

## 2023-08-22 MED ORDER — PREDNISONE 20 MG PO TABS: 20.0000 mg | ORAL_TABLET | Freq: Every day | ORAL | 0 refills | Status: DC

## 2023-08-22 MED ORDER — AMOXICILLIN-POT CLAVULANATE 875-125 MG PO TABS: 1.0000 | ORAL_TABLET | Freq: Two times a day (BID) | ORAL | 0 refills | Status: DC

## 2023-08-22 NOTE — Assessment & Plan Note (Signed)
Few years-worsening No history of GERD or heartburn  Solid food / occational has to vomit it back up  Concern for stricture or other esophageal issue   Is eating slowly /small bites Ref to Gi for further eval

## 2023-08-22 NOTE — Assessment & Plan Note (Signed)
Ongoing/ years  Has seen Dr Willeen Cass in past? Wants to follow up  Affects taste and eating   Currently has sinus infection-hope treatment of that will help   Referral sent to ENT

## 2023-08-22 NOTE — Progress Notes (Signed)
Subjective:    Patient ID: Deborah Orozco, female    DOB: 03-21-1956, 68 y.o.   MRN: [id number]  HPI  Wt Readings from Last 3 Encounters:  08/22/23 182 lb (82.6 kg)  03/18/23 177 lb 4 oz (80.4 kg)  03/01/23 182 lb 12.8 oz (82.9 kg)   28.51 kg/m  Vitals:   08/22/23 1201  BP: 132/80  Pulse: (!) 103  Temp: 98.3 F (36.8 C)  SpO2: 98%    Pt presents with sinus complaints and cough  Follow up of multiple health issues   Sinus stuff for about 2 mo ago with a bad cold  Had a bad ST to start  Better for 3 days Then symptoms returned - worse in head  Pnd all day long  Headache  Ears hurt when she blows nose-bilateral  Dizzy  Weak feeling  Worst above eyes- both sides Mucous is yellow to white   Some chills at night  ? Fever   Cough  Some phlegm - ? Color  No wheezing  Chest is sore however   Is tired     Over the counter Advil cold and sinus Day quil  Ny quil  No ns  No saline    Swallowing problem - few years  Getting worse  Worse with rice/ bread  No problems swallowing pills or fluids   Foot gets caught part way down  Has to vomit occational to clear it  Tries to eat more slowly   No heartburn  No GERD symptoms   No swelling of tongue or mouth or throat    Brother had to have esoph dilatation in past    Sense of smell is decreasing with time  Did not start with covid or virus /has been for years   Has seen ENT in past  Needs follow up     Patient Active Problem List   Diagnosis Date Noted   Acute sinusitis 08/22/2023   Esophageal dysphagia 08/22/2023   Congestion of nasal sinus 03/18/2023   Family history of breast cancer in mother 11/15/2022   Sebaceous cyst 09/24/2020   High myopia, both eyes 06/04/2019   Hypertension, essential 08/20/2018   Dysthymia 03/03/2018   Elevated random blood glucose level 02/28/2018   Productive cough 10/06/2017   Fatigue 04/01/2017   Loss of smell 07/05/2015   Osteopenia 07/05/2015   Family  history of colon cancer 11/20/2013   Nuclear sclerotic cataract of both eyes 04/01/2013   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   Colon cancer screening 07/09/2012   Breast screening 04/08/2012   Routine general medical examination at a health care facility 12/20/2011   Vitamin D deficiency 03/14/2010   Past Medical History:  Diagnosis Date   Hypertension    Post-menopausal    Sleep apnea    use cpap   Past Surgical History:  Procedure Laterality Date   BREAST BIOPSY Left 06/18/2023   MM LT BREAST BX W LOC DEV 1ST LESION IMAGE BX SPEC STEREO GUIDE 06/18/2023 [facility]   COLONOSCOPY  2015   Social History   Tobacco Use   Smoking status: Never   Smokeless tobacco: Never  Vaping Use   Vaping status: Never Used  Substance Use Topics   Alcohol use: Yes    Alcohol/week: 2.0 standard drinks of alcohol    Types: 2 Glasses of wine per week    Comment: occ   Drug use: No   Family History  Problem Relation Age of Onset   Cancer Mother  45       Breast twice recurred at 46/colon/liver   Diabetes Mother    Hypertension Mother    Colon cancer Mother 70   Hypertension Father    Heart disease Brother    Colon polyps Brother    Cancer Maternal Grandfather        possible colon cancer unsure of exact location gastro/tn   Stomach cancer Maternal Grandmother    Esophageal cancer Neg Hx    Rectal cancer Neg Hx    Allergies  Allergen Reactions   Amlodipine     dizzy   Current Outpatient Medications on File Prior to Visit  Medication Sig Dispense Refill   Cholecalciferol (VITAMIN D-3) 5000 units TABS Take 1 capsule by mouth daily.     IMVEXXY MAINTENANCE PACK 10 MCG INST SMARTSIG:1 Insert Vaginal Twice a Week     lisinopril (ZESTRIL) 10 MG tablet TAKE 1 TABLET BY MOUTH EVERY DAY 90 tablet 1   Multiple Vitamin (MULTIVITAMIN) capsule Take 1 capsule by mouth daily.     Probiotic Product (PROBIOTIC PO) Take 1 capsule by mouth every morning.     No current  facility-administered medications on file prior to visit.    Review of Systems  Constitutional:  Positive for appetite change. Negative for fatigue and fever.  HENT:  Positive for congestion, ear pain, postnasal drip, rhinorrhea, sinus pressure, sore throat and trouble swallowing. Negative for nosebleeds.   Eyes:  Negative for pain, redness and itching.  Respiratory:  Positive for cough. Negative for shortness of breath and wheezing.   Cardiovascular:  Negative for chest pain.  Gastrointestinal:  Negative for abdominal pain, diarrhea, nausea and vomiting.  Endocrine: Negative for polyuria.  Genitourinary:  Negative for dysuria, frequency and urgency.  Musculoskeletal:  Negative for arthralgias and myalgias.  Allergic/Immunologic: Negative for immunocompromised state.  Neurological:  Positive for headaches. Negative for dizziness, tremors, syncope, weakness and numbness.  Hematological:  Negative for adenopathy. Does not bruise/bleed easily.  Psychiatric/Behavioral:  Negative for dysphoric mood. The patient is not nervous/anxious.        Objective:   Physical Exam Constitutional:      General: She is not in acute distress.    Appearance: Normal appearance. She is well-developed and normal weight. She is not ill-appearing or diaphoretic.  HENT:     Head: Normocephalic and atraumatic.     Comments: Bilateral maxillary and frontal sinus tenderness    Right Ear: Tympanic membrane and external ear normal.     Left Ear: Tympanic membrane and external ear normal.     Nose: Congestion and rhinorrhea present.     Mouth/Throat:     Pharynx: Oropharynx is clear. No oropharyngeal exudate or posterior oropharyngeal erythema.     Comments: Clear pnd Eyes:     General: No scleral icterus.       Right eye: No discharge.        Left eye: No discharge.     Conjunctiva/sclera: Conjunctivae normal.     Pupils: Pupils are equal, round, and reactive to light.  Neck:     Vascular: No carotid bruit.   Cardiovascular:     Rate and Rhythm: Regular rhythm. Tachycardia present.     Heart sounds: Normal heart sounds.  Pulmonary:     Effort: Pulmonary effort is normal. No respiratory distress.     Breath sounds: No stridor. Wheezing present. No rhonchi or rales.     Comments: Good air exch No rales or rhonchi Occational scant end exp  wheeze  Chest:     Chest wall: No tenderness.  Abdominal:     General: Abdomen is protuberant. Bowel sounds are normal. There is no distension or abdominal bruit.     Palpations: There is no shifting dullness, hepatomegaly, splenomegaly, mass or pulsatile mass.     Tenderness: There is no abdominal tenderness.  Musculoskeletal:     Cervical back: Normal range of motion and neck supple. No tenderness.  Lymphadenopathy:     Cervical: No cervical adenopathy.  Skin:    General: Skin is warm and dry.     Findings: No rash.  Neurological:     Mental Status: She is alert.     Cranial Nerves: No cranial nerve deficit.     Motor: No weakness.     Coordination: Coordination normal.     Gait: Gait normal.  Psychiatric:        Mood and Affect: Mood normal.           Assessment & Plan:   Problem List Items Addressed This Visit       Respiratory   Acute sinusitis - Primary   2 mo of sinus pressure/pain since uri , with colored nasal mucous and cough Reassuring exam, did note some mild end exp wheee Will treat with augmentin  prednisone 20 mg daily for 5 d for congestion and wheeze See AVS-symptom care  Call back and Er precautions noted in detail today         Relevant Medications   amoxicillin-clavulanate (AUGMENTIN) 875-125 MG tablet   predniSONE (DELTASONE) 20 MG tablet     Digestive   Esophageal dysphagia   Few years-worsening No history of GERD or heartburn  Solid food / occational has to vomit it back up  Concern for stricture or other esophageal issue   Is eating slowly /small bites Ref to Gi for further eval      Relevant  Orders   Ambulatory referral to Gastroenterology     Other   Loss of smell   Ongoing/ years  Has seen Dr Willeen Cass in past? Wants to follow up  Affects taste and eating   Currently has sinus infection-hope treatment of that will help   Referral sent to ENT      Relevant Orders   Ambulatory referral to ENT

## 2023-08-22 NOTE — Assessment & Plan Note (Signed)
2 mo of sinus pressure/pain since uri , with colored nasal mucous and cough Reassuring exam, did note some mild end exp wheee Will treat with augmentin  prednisone 20 mg daily for 5 d for congestion and wheeze See AVS-symptom care  Call back and Er precautions noted in detail today

## 2023-08-22 NOTE — Patient Instructions (Signed)
Drink fluids and rest  mucinex DM is good for cough and congestion  Nasal saline for congestion as needed  Tylenol for fever or pain or headache  Please alert [place] if symptoms worsen (if severe or short of breath please go to the ER)    Take augmentin as directed for bacterial sinus infection  Take prednisone for congestion and wheezing  (it may make you feel hyper and hungry)   Update if not starting to improve in a week or if worsening    For loss of smell I put the referral in for ENT  Please let us know if you don't hear in 1-2 weeks    For swallowing issue I put the referral in for GI ([facility])  Please let us know if you don't hear in 1-2 weeks

## 2023-08-27 ENCOUNTER — Encounter: Payer: Self-pay | Admitting: *Deleted

## 2023-09-06 ENCOUNTER — Encounter: Payer: Self-pay | Admitting: Gastroenterology

## 2023-09-06 ENCOUNTER — Telehealth: Payer: Self-pay

## 2023-09-06 NOTE — Telephone Encounter (Signed)
Called pt and she finished up abx from the sinus inf PCP prescribed on 08/22/23. She did say she feels better but she still has a constant runny nose that sometimes triggers a HA. Pt got over the counter Allergy Relief antihistamine 10 mg (OTC) and she wanted to make sure that it's okay to take with her other meds. If not she asked PCP what med does she recommend for her constant runny nose

## 2023-09-06 NOTE — Telephone Encounter (Signed)
Deborah Orozco that  If not improving come in for a visit Thanks for letting me know

## 2023-09-06 NOTE — Telephone Encounter (Signed)
Left VM letting pt know Dr. Royden Purl comments

## 2023-09-06 NOTE — Telephone Encounter (Signed)
Copied from CRM [id number]. Topic: Clinical - Medication Question >> Sep 06, 2023 11:04 AM Lennart Pall wrote: Reason for CRM: Patient has a few questions about an over the counter medication that she wants to take, wants to make sure it is okay with her usual medications she is currently taking. Please reach out to patient as soon as possible  [phone]

## 2023-09-12 ENCOUNTER — Other Ambulatory Visit: Payer: Self-pay | Admitting: Otolaryngology

## 2023-09-12 DIAGNOSIS — R430 Anosmia: Secondary | ICD-10-CM

## 2023-09-13 ENCOUNTER — Encounter: Payer: Self-pay | Admitting: Otolaryngology

## 2023-09-20 ENCOUNTER — Encounter: Payer: Self-pay | Admitting: Otolaryngology

## 2023-09-27 ENCOUNTER — Telehealth: Payer: Self-pay | Admitting: Internal Medicine

## 2023-09-27 NOTE — Telephone Encounter (Signed)
 We do not currently have any availabilities before 10/09/23. Will watch schedule for sooner availability and reach out to patient if there is one.

## 2023-09-27 NOTE — Telephone Encounter (Signed)
 This patient is a friend of Dr. Laury Axon.  Dr. Laury Axon messaged me to see if we can get this patient in sooner than 10/09/23. Dr. Laury Axon thinks that patient may have an esophageal stricture. Patient has an upcoming cruise and Dr. Laury Axon is afraid that patient may choke while on cruise.

## 2023-09-30 NOTE — Telephone Encounter (Signed)
 Contacted patient to offer appointment with Hyacinth Meeker, PA-C for today, 09/30/23 at 2:30 pm. Unfortunately, she says she would be unable to make this appointment. No sooner availabilities at this time. Patient advised we will keep her scheduled for 10/09/23 as previously planned.

## 2023-10-01 ENCOUNTER — Other Ambulatory Visit: Payer: 59

## 2023-10-07 ENCOUNTER — Encounter: Payer: Self-pay | Admitting: Gastroenterology

## 2023-10-09 ENCOUNTER — Ambulatory Visit: Payer: 59 | Admitting: Gastroenterology

## 2023-10-09 ENCOUNTER — Telehealth: Payer: Self-pay | Admitting: *Deleted

## 2023-10-09 DIAGNOSIS — Z87898 Personal history of other specified conditions: Secondary | ICD-10-CM | POA: Insufficient documentation

## 2023-10-09 MED ORDER — SCOPOLAMINE 1 MG/3DAYS TD PT72: 1.0000 | MEDICATED_PATCH | TRANSDERMAL | 0 refills | Status: DC

## 2023-10-09 NOTE — Telephone Encounter (Signed)
 I sent it  Caution of sedation   May also cause dry mouth  Hope it helps

## 2023-10-09 NOTE — Telephone Encounter (Signed)
Pt notified of Dr. Marliss Coots comments and that Rx was sent to pharmacy

## 2023-10-09 NOTE — Telephone Encounter (Signed)
 Copied from CRM [id number]. Topic: Clinical - Medication Question >> Oct 09, 2023 10:02 AM Alcus Dad wrote: Reason for CRM: Patient is going on cruise tomorrow and wanted to know if Dr. Could send her in a prescription for motion sickness patches

## 2023-11-06 ENCOUNTER — Other Ambulatory Visit

## 2023-11-11 ENCOUNTER — Other Ambulatory Visit: Payer: No Typology Code available for payment source

## 2023-11-18 ENCOUNTER — Encounter: Payer: No Typology Code available for payment source | Admitting: Family Medicine

## 2023-11-26 ENCOUNTER — Telehealth: Payer: Self-pay | Admitting: Family Medicine

## 2023-11-26 DIAGNOSIS — I10 Essential (primary) hypertension: Secondary | ICD-10-CM

## 2023-11-26 DIAGNOSIS — E559 Vitamin D deficiency, unspecified: Secondary | ICD-10-CM

## 2023-11-26 NOTE — Telephone Encounter (Signed)
-----   Message from Gerry Krone sent at 11/07/2023  3:46 PM EDT ----- Regarding: Lab orders for [facility], 5.8.25 Patient is scheduled for CPX labs, please order future labs, Thanks , Anselmo Kings

## 2023-11-28 ENCOUNTER — Other Ambulatory Visit: Payer: No Typology Code available for payment source

## 2023-11-28 ENCOUNTER — Ambulatory Visit: Admitting: Gastroenterology

## 2023-11-28 ENCOUNTER — Encounter: Payer: Self-pay | Admitting: Gastroenterology

## 2023-11-28 VITALS — BP 120/74 | HR 78 | Ht 67.0 in | Wt 187.4 lb

## 2023-11-28 DIAGNOSIS — Z860101 Personal history of adenomatous and serrated colon polyps: Secondary | ICD-10-CM | POA: Diagnosis not present

## 2023-11-28 DIAGNOSIS — R1310 Dysphagia, unspecified: Secondary | ICD-10-CM

## 2023-11-28 DIAGNOSIS — Z800 Family history of malignant neoplasm of digestive organs: Secondary | ICD-10-CM

## 2023-11-28 DIAGNOSIS — Z1211 Encounter for screening for malignant neoplasm of colon: Secondary | ICD-10-CM

## 2023-11-28 NOTE — Progress Notes (Signed)
 Chief Complaint: Dysphagia Primary GI MD: Dr. Elvin Hammer  HPI: Discussed the use of AI scribe software for clinical note transcription with the patient, who gave verbal consent to proceed.  History of Present Illness Deborah Orozco is a 68 year old female who presents with swallowing difficulties.  She has experienced swallowing difficulties for an extended period, initially with rice and now with other solid foods. The sensation is described as an inability to swallow, feeling completely blocked, and leading to a panicky feeling. Episodes are random and not linked to any specific trigger.  No heartburn, nausea, or vomiting. She has regurgitated to relieve the blockage on a few occasions. Episodes often start with hiccups, and she feels like she can't take in air, which initially caused panic, but she has learned to relax and wait for it to pass. The swallowing difficulty is only associated with solid foods and not liquids. No associated abdominal pain is reported.  Her last colonoscopy was in 2021, and she is on a five-year follow-up schedule due to a small polyp and a family history of colon cancer, as her mother had colon cancer.    PREVIOUS GI WORKUP   Colonoscopy 05/2020 - One 6 mm polyp (sessile serrated polyp without dysplasia) in the proximal ascending colon, removed with a cold snare. Resected and retrieved.  - Diverticulosis in the entire examined colon.  - Internal hemorrhoids. - Repeat 5 years (05/2025) due to family history of colon cancer in mother  Past Medical History:  Diagnosis Date   Hypertension    Post-menopausal    Sleep apnea    use cpap    Past Surgical History:  Procedure Laterality Date   BREAST BIOPSY Left 06/18/2023   MM LT BREAST BX W LOC DEV 1ST LESION IMAGE BX SPEC STEREO GUIDE 06/18/2023 [facility]   COLONOSCOPY  2015    Current Outpatient Medications  Medication Sig Dispense Refill   Cholecalciferol (VITAMIN D -3) 5000 units TABS Take  1 capsule by mouth daily.     lisinopril  (ZESTRIL ) 10 MG tablet TAKE 1 TABLET BY MOUTH EVERY DAY 90 tablet 1   Multiple Vitamin (MULTIVITAMIN) capsule Take 1 capsule by mouth daily.     Probiotic Product (PROBIOTIC PO) Take 1 capsule by mouth every morning.     amoxicillin -clavulanate (AUGMENTIN ) 875-125 MG tablet Take 1 tablet by mouth 2 (two) times daily. (Patient not taking: Reported on 11/28/2023) 14 tablet 0   IMVEXXY MAINTENANCE PACK 10 MCG INST SMARTSIG:1 Insert Vaginal Twice a Week (Patient not taking: Reported on 11/28/2023)     predniSONE  (DELTASONE ) 20 MG tablet Take 1 tablet (20 mg total) by mouth daily with breakfast. (Patient not taking: Reported on 11/28/2023) 5 tablet 0   scopolamine  (TRANSDERM-SCOP) 1 MG/3DAYS Place 1 patch (1.5 mg total) onto the skin every 3 (three) days. (Patient not taking: Reported on 11/28/2023) 5 patch 0   No current facility-administered medications for this visit.    Allergies as of 11/28/2023 - Review Complete 11/28/2023  Allergen Reaction Noted   Amlodipine   12/12/2018    Family History  Problem Relation Age of Onset   Cancer Mother 71       Breast twice recurred at 46/colon/liver   Diabetes Mother    Hypertension Mother    Colon cancer Mother 62   Hypertension Father    Heart disease Brother    Colon polyps Brother    Cancer Maternal Grandfather        possible colon cancer unsure of exact  location gastro/tn   Stomach cancer Maternal Grandmother    Esophageal cancer Neg Hx    Rectal cancer Neg Hx     Social History   Socioeconomic History   Marital status: Married    Spouse name: Not on file   Number of children: Not on file   Years of education: Not on file   Highest education level: Not on file  Occupational History   Not on file  Tobacco Use   Smoking status: Never   Smokeless tobacco: Never  Vaping Use   Vaping status: Never Used  Substance and Sexual Activity   Alcohol use: Yes    Alcohol/week: 2.0 standard drinks of  alcohol    Types: 2 Glasses of wine per week    Comment: occ   Drug use: No   Sexual activity: Yes    Partners: Male    Birth control/protection: Post-menopausal  Other Topics Concern   Not on file  Social History Narrative   Not on file   Social Drivers of Health   Financial Resource Strain: Not on file  Food Insecurity: Not on file  Transportation Needs: Not on file  Physical Activity: Not on file  Stress: Not on file  Social Connections: Not on file  Intimate Partner Violence: Not on file    Review of Systems:    Constitutional: No weight loss, fever, chills, weakness or fatigue HEENT: Eyes: No change in vision               Ears, Nose, Throat:  No change in hearing or congestion Skin: No rash or itching Cardiovascular: No chest pain, chest pressure or palpitations   Respiratory: No SOB or cough Gastrointestinal: See HPI and otherwise negative Genitourinary: No dysuria or change in urinary frequency Neurological: No headache, dizziness or syncope Musculoskeletal: No new muscle or joint pain Hematologic: No bleeding or bruising Psychiatric: No history of depression or anxiety    Physical Exam:  Vital signs: BP 120/74   Pulse 78   Ht 5\' 7"  (1.702 m)   Wt 187 lb 6 oz (85 kg)   BMI 29.35 kg/m   Constitutional: NAD, alert and cooperative Head:  Normocephalic and atraumatic. Eyes:   PEERL, EOMI. No icterus. Conjunctiva pink. Respiratory: Respirations even and unlabored. Lungs clear to auscultation bilaterally.   No wheezes, crackles, or rhonchi.  Cardiovascular:  Regular rate and rhythm. No peripheral edema, cyanosis or pallor.  Gastrointestinal:  Soft, nondistended, nontender. No rebound or guarding. Normal bowel sounds. No appreciable masses or hepatomegaly. Rectal:  Declines Msk:  Symmetrical without gross deformities. Without edema, no deformity or joint abnormality.  Neurologic:  Alert and  oriented x4;  grossly normal neurologically.  Skin:   Dry and intact  without significant lesions or rashes. Psychiatric: Oriented to person, place and time. Demonstrates good judgement and reason without abnormal affect or behaviors.  RELEVANT LABS AND IMAGING: CBC    Component Value Date/Time   WBC 5.5 10/31/2022 0942   RBC 4.79 10/31/2022 0942   HGB 14.0 10/31/2022 0942   HCT 42.3 10/31/2022 0942   PLT 244.0 10/31/2022 0942   MCV 88.4 10/31/2022 0942   MCH 26.7 06/06/2012 1532   MCHC 33.2 10/31/2022 0942   RDW 13.4 10/31/2022 0942   LYMPHSABS 1.3 10/31/2022 0942   MONOABS 0.4 10/31/2022 0942   EOSABS 0.3 10/31/2022 0942   BASOSABS 0.1 10/31/2022 0942    CMP     Component Value Date/Time   NA 141 10/31/2022 0942  K 3.9 10/31/2022 0942   CL 104 10/31/2022 0942   CO2 28 10/31/2022 0942   GLUCOSE 87 10/31/2022 0942   BUN 19 10/31/2022 0942   CREATININE 0.84 10/31/2022 0942   CREATININE 0.77 06/06/2012 1532   CALCIUM 9.5 10/31/2022 0942   PROT 6.7 10/31/2022 0942   ALBUMIN 4.5 10/31/2022 0942   AST 18 10/31/2022 0942   ALT 16 10/31/2022 0942   ALKPHOS 80 10/31/2022 0942   BILITOT 0.8 10/31/2022 0942   GFRNONAA 91.81 03/14/2010 1158     Assessment/Plan:   Dysphagia Solid food dysphagia with episodes of food impaction resulting in excess saliva and regurgitation.  No nausea, vomiting, GERD.  No previous EGD.  No NSAIDs.  DDx includes esophagitis, stricture, mass, dysmotility, spasm. - EGD with possible dilation for further evaluation - I thoroughly discussed the procedure with the patient (at bedside) to include nature of the procedure, alternatives, benefits, and risks (including but not limited to bleeding, infection, perforation, anesthesia/cardiac pulmonary complications).  Patient verbalized understanding and gave verbal consent to proceed with procedure. - If EGD is unrevealing consider barium swallow - PPI after EGD  History of colon polyps Family history of colon cancer in mother Colonoscopy 05/2020 with sessile serrated  polyp without dysplasia.  Due for repeat 05/2025 - Colonoscopy repeat 05/2025     Gigi [address] [facility] 11/28/2023, 3:12 PM  Cc: Tower, Manley Seeds, MD

## 2023-11-28 NOTE — Patient Instructions (Signed)
 You have been scheduled for an endoscopy. Please follow written instructions given to you at your visit today.  If you use inhalers (even only as needed), please bring them with you on the day of your procedure.  If you take any of the following medications, they will need to be adjusted prior to your procedure:   DO NOT TAKE 7 DAYS PRIOR TO TEST- Trulicity (dulaglutide) Ozempic, Wegovy (semaglutide) Mounjaro (tirzepatide) Bydureon Bcise (exanatide extended release)  DO NOT TAKE 1 DAY PRIOR TO YOUR TEST Rybelsus (semaglutide) Adlyxin (lixisenatide) Victoza (liraglutide) Byetta (exanatide) ___________________________________________________________________________  Please follow up sooner if symptoms increase or worsen  Due to recent changes in healthcare laws, you may see the results of your imaging and laboratory studies on MyChart before your provider has had a chance to review them.  We understand that in some cases there may be results that are confusing or concerning to you. Not all laboratory results come back in the same time frame and the provider may be waiting for multiple results in order to interpret others.  Please give us  48 hours in order for your provider to thoroughly review all the results before contacting the office for clarification of your results.   _______________________________________________________  If your blood pressure at your visit was 140/90 or greater, please contact your primary care physician to follow up on this.  _______________________________________________________  If you are age 39 or older, your body mass index should be between 23-30. Your Body mass index is 29.35 kg/m. If this is out of the aforementioned range listed, please consider follow up with your Primary Care Provider.  If you are age 52 or younger, your body mass index should be between 19-25. Your Body mass index is 29.35 kg/m. If this is out of the aformentioned range listed,  please consider follow up with your Primary Care Provider.   ________________________________________________________  The  GI providers would like to encourage you to use MYCHART to communicate with providers for non-urgent requests or questions.  Due to long hold times on the telephone, sending your provider a message by [facility] may be a faster and more efficient way to get a response.  Please allow 48 business hours for a response.  Please remember that this is for non-urgent requests.  _______________________________________________________ Thank you for trusting me with your gastrointestinal care!   Suzanna Erp, PA

## 2023-12-06 ENCOUNTER — Encounter: Payer: No Typology Code available for payment source | Admitting: Family Medicine

## 2023-12-10 NOTE — Progress Notes (Signed)
 Noted

## 2023-12-12 ENCOUNTER — Other Ambulatory Visit: Payer: Self-pay | Admitting: Obstetrics and Gynecology

## 2023-12-12 DIAGNOSIS — Z9189 Other specified personal risk factors, not elsewhere classified: Secondary | ICD-10-CM

## 2023-12-19 ENCOUNTER — Ambulatory Visit: Payer: Self-pay | Admitting: Family Medicine

## 2023-12-19 ENCOUNTER — Other Ambulatory Visit

## 2023-12-19 DIAGNOSIS — E559 Vitamin D deficiency, unspecified: Secondary | ICD-10-CM | POA: Diagnosis not present

## 2023-12-19 DIAGNOSIS — I10 Essential (primary) hypertension: Secondary | ICD-10-CM

## 2023-12-19 LAB — COMPREHENSIVE METABOLIC PANEL WITH GFR
ALT: 14 U/L (ref 0–35)
AST: 15 U/L (ref 0–37)
Albumin: 4.3 g/dL (ref 3.5–5.2)
Alkaline Phosphatase: 77 U/L (ref 39–117)
BUN: 20 mg/dL (ref 6–23)
CO2: 28 meq/L (ref 19–32)
Calcium: 9.2 mg/dL (ref 8.4–10.5)
Chloride: 109 meq/L (ref 96–112)
Creatinine, Ser: 0.86 mg/dL (ref 0.40–1.20)
GFR: 70.63 mL/min (ref >60.00)
Glucose, Bld: 94 mg/dL (ref 70–99)
Potassium: 4.2 meq/L (ref 3.5–5.1)
Sodium: 142 meq/L (ref 135–145)
Total Bilirubin: 0.4 mg/dL (ref 0.2–1.2)
Total Protein: 6 g/dL (ref 6.0–8.3)

## 2023-12-19 LAB — CBC WITH DIFFERENTIAL/PLATELET
Basophils Absolute: 0.1 10*3/uL (ref 0.0–0.1)
Basophils Relative: 1.2 % (ref 0.0–3.0)
Eosinophils Absolute: 0.4 10*3/uL (ref 0.0–0.7)
Eosinophils Relative: 7.5 % — ABNORMAL HIGH (ref 0.0–5.0)
HCT: 38 % (ref 36.0–46.0)
Hemoglobin: 13 g/dL (ref 12.0–15.0)
Lymphocytes Relative: 28.2 % (ref 12.0–46.0)
Lymphs Abs: 1.5 10*3/uL (ref 0.7–4.0)
MCHC: 34.1 g/dL (ref 30.0–36.0)
MCV: 86.6 fl (ref 78.0–100.0)
Monocytes Absolute: 0.4 10*3/uL (ref 0.1–1.0)
Monocytes Relative: 8 % (ref 3.0–12.0)
Neutro Abs: 2.9 10*3/uL (ref 1.4–7.7)
Neutrophils Relative %: 55.1 % (ref 43.0–77.0)
Platelets: 228 10*3/uL (ref 150.0–400.0)
RBC: 4.39 Mil/uL (ref 3.87–5.11)
RDW: 13.4 % (ref 11.5–15.5)
WBC: 5.2 10*3/uL (ref 4.0–10.5)

## 2023-12-19 LAB — VITAMIN D 25 HYDROXY (VIT D DEFICIENCY, FRACTURES): VITD: 63.71 ng/mL (ref 30.00–100.00)

## 2023-12-19 LAB — LIPID PANEL
Cholesterol: 181 mg/dL (ref 0–200)
HDL: 71.8 mg/dL (ref >39.00)
LDL Cholesterol: 93 mg/dL (ref 0–99)
NonHDL: 109.17
Total CHOL/HDL Ratio: 3
Triglycerides: 80 mg/dL (ref 0.0–149.0)
VLDL: 16 mg/dL (ref 0.0–40.0)

## 2023-12-19 LAB — TSH: TSH: 7.32 u[IU]/mL — ABNORMAL HIGH (ref 0.35–5.50)

## 2023-12-24 ENCOUNTER — Ambulatory Visit
Admission: RE | Admit: 2023-12-24 | Discharge: 2023-12-24 | Disposition: A | Discharge status: Home / Self Care | Source: Ambulatory Visit | Attending: Otolaryngology | Admitting: Otolaryngology

## 2023-12-24 DIAGNOSIS — R430 Anosmia: Secondary | ICD-10-CM

## 2023-12-24 MED ORDER — GADOPICLENOL 0.5 MMOL/ML IV SOLN
7.5000 mL | Freq: Once | INTRAVENOUS | Status: AC | PRN
  Administered 2023-12-24: 7.5 mL via INTRAVENOUS

## 2023-12-27 ENCOUNTER — Encounter: Admitting: Family Medicine

## 2024-01-17 ENCOUNTER — Encounter: Payer: Self-pay | Admitting: Obstetrics and Gynecology

## 2024-01-21 ENCOUNTER — Encounter: Payer: Self-pay | Admitting: Internal Medicine

## 2024-01-28 ENCOUNTER — Other Ambulatory Visit

## 2024-01-29 ENCOUNTER — Ambulatory Visit
Admission: RE | Admit: 2024-01-29 | Discharge: 2024-01-29 | Disposition: A | Discharge status: Home / Self Care | Source: Ambulatory Visit | Attending: Obstetrics and Gynecology | Admitting: Obstetrics and Gynecology

## 2024-01-29 DIAGNOSIS — Z9189 Other specified personal risk factors, not elsewhere classified: Secondary | ICD-10-CM

## 2024-01-29 MED ORDER — GADOPICLENOL 0.5 MMOL/ML IV SOLN
8.0000 mL | Freq: Once | INTRAVENOUS | Status: AC | PRN
  Administered 2024-01-29: 8 mL via INTRAVENOUS

## 2024-01-30 ENCOUNTER — Ambulatory Visit: Admitting: Internal Medicine

## 2024-01-30 ENCOUNTER — Encounter: Payer: Self-pay | Admitting: Internal Medicine

## 2024-01-30 ENCOUNTER — Other Ambulatory Visit: Payer: Self-pay | Admitting: Internal Medicine

## 2024-01-30 VITALS — BP 133/82 | HR 55 | Temp 97.6°F | Resp 9 | Ht 67.0 in | Wt 187.0 lb

## 2024-01-30 DIAGNOSIS — K449 Diaphragmatic hernia without obstruction or gangrene: Secondary | ICD-10-CM

## 2024-01-30 DIAGNOSIS — K222 Esophageal obstruction: Secondary | ICD-10-CM | POA: Diagnosis present

## 2024-01-30 DIAGNOSIS — K314 Gastric diverticulum: Secondary | ICD-10-CM | POA: Diagnosis not present

## 2024-01-30 DIAGNOSIS — K259 Gastric ulcer, unspecified as acute or chronic, without hemorrhage or perforation: Secondary | ICD-10-CM

## 2024-01-30 DIAGNOSIS — K3189 Other diseases of stomach and duodenum: Secondary | ICD-10-CM

## 2024-01-30 DIAGNOSIS — R1310 Dysphagia, unspecified: Secondary | ICD-10-CM

## 2024-01-30 DIAGNOSIS — K2100 Gastro-esophageal reflux disease with esophagitis, without bleeding: Secondary | ICD-10-CM | POA: Diagnosis not present

## 2024-01-30 MED ORDER — PANTOPRAZOLE SODIUM 40 MG PO TBEC: 40.0000 mg | DELAYED_RELEASE_TABLET | Freq: Every day | ORAL | 11 refills | Status: DC

## 2024-01-30 MED ORDER — SODIUM CHLORIDE 0.9 % IV SOLN: 500.0000 mL | Freq: Once | INTRAVENOUS | Status: DC

## 2024-01-30 NOTE — Op Note (Signed)
 [facility] Patient Name: Deborah Orozco Procedure Date: 01/30/2024 12:00 PM MRN: [id number] Endoscopist: Norleen SAILOR. Abran , MD, [id number] Age: 68 Referring MD:  Date of Birth: 11-15-1955 Gender: Female Account #: [id number] Procedure:                Upper GI endoscopy with biopsies; balloon dilation                            of the esophagus?"19 mm max Indications:              Therapeutic procedure, Dysphagia Medicines:                Monitored Anesthesia Care Procedure:                Pre-Anesthesia Assessment:                           - Prior to the procedure, a History and Physical                            was performed, and patient medications and                            allergies were reviewed. The patient's tolerance of                            previous anesthesia was also reviewed. The risks                            and benefits of the procedure and the sedation                            options and risks were discussed with the patient.                            All questions were answered, and informed consent                            was obtained. Prior Anticoagulants: The patient has                            taken no anticoagulant or antiplatelet agents. ASA                            Grade Assessment: II - A patient with mild systemic                            disease. After reviewing the risks and benefits,                            the patient was deemed in satisfactory condition to                            undergo the procedure.  After obtaining informed consent, the endoscope was                            passed under direct vision. Throughout the                            procedure, the patient's blood pressure, pulse, and                            oxygen saturations were monitored continuously. The                            GIF [id number] #[id number] was introduced through the                            mouth, and  advanced to the second part of duodenum.                            The upper GI endoscopy was accomplished without                            difficulty. The patient tolerated the procedure                            well. Scope In: Scope Out: Findings:                 The esophagus revealed reflux esophagitis as                            manifested by edema and slight friability at the                            mucosal Z-line.                           One benign-appearing, intrinsic moderate ringlike                            stenosis was found 34 cm from the incisors. This                            stenosis measured 1.5 cm (inner diameter). The                            stenosis was traversed. A TTS dilator was passed                            through the scope. Dilation with an 18-19-20 mm                            balloon dilator was performed to 19 mm. There was                            no mucosal  disruption at 18 mm. There was mucosal                            disruption with the 19 mm dilation. See images.                           The stomach revealed a hiatal hernia and a small                            diverticulum in the fundus. There were multiple                            superficial antral erosions. Biopsies were taken                            with a cold forceps, from the antrum, for histology                            to rule out H. pylori.                           The examined duodenum was normal.                           The cardia and gastric fundus were normal on                            retroflexion with the exception of above findings. Complications:            No immediate complications. Estimated Blood Loss:     Estimated blood loss: none. Impression:               1. GERD complicated by esophagitis and peptic                            stricture status post dilation                           2. Hiatal hernia and small fundic diverticulum                            3. Antral erosions status post biopsies Recommendation:           1. Patient has a contact number available for                            emergencies. The signs and symptoms of potential                            delayed complications were discussed with the                            patient. Return to normal activities tomorrow.  Written discharge instructions were provided to the                            patient.                           2. Post dilation diet. N.p.o for 1 hour then clear                            liquids for 2 hours then soft foods till a.m.                            Resume regular diet tomorrow.                           3. Continue present medications.                           4. Prescribe PANTOPRAZOLE  40 mg daily; #30; 11                            refills. This will keep the inflammation from                            injuring your esophagus                           5. Office follow-up with Dr. Abran in 6 to 8 weeks Norleen SAILOR. Abran, MD 01/30/2024 12:30:57 PM This report has been signed electronically.

## 2024-01-30 NOTE — Progress Notes (Signed)
 Called to room to assist during endoscopic procedure.  Patient ID and intended procedure confirmed with present staff. Received instructions for my participation in the procedure from the performing physician.

## 2024-01-30 NOTE — Progress Notes (Signed)
 Expand All Collapse All     Chief Complaint: Dysphagia Primary GI MD: Dr. Abran   HPI: Discussed the use of AI scribe software for clinical note transcription with the patient, who gave verbal consent to proceed.   History of Present Illness Deborah Orozco is a 68 year old female who presents with swallowing difficulties.   She has experienced swallowing difficulties for an extended period, initially with rice and now with other solid foods. The sensation is described as an inability to swallow, feeling completely blocked, and leading to a panicky feeling. Episodes are random and not linked to any specific trigger.   No heartburn, nausea, or vomiting. She has regurgitated to relieve the blockage on a few occasions. Episodes often start with hiccups, and she feels like she can't take in air, which initially caused panic, but she has learned to relax and wait for it to pass. The swallowing difficulty is only associated with solid foods and not liquids. No associated abdominal pain is reported.   Her last colonoscopy was in 2021, and she is on a five-year follow-up schedule due to a small polyp and a family history of colon cancer, as her mother had colon cancer.       PREVIOUS GI WORKUP    Colonoscopy 05/2020 - One 6 mm polyp (sessile serrated polyp without dysplasia) in the proximal ascending colon, removed with a cold snare. Resected and retrieved.  - Diverticulosis in the entire examined colon.  - Internal hemorrhoids. - Repeat 5 years (05/2025) due to family history of colon cancer in mother       Past Medical History:  Diagnosis Date   Hypertension     Post-menopausal     Sleep apnea      use cpap               Past Surgical History:  Procedure Laterality Date   BREAST BIOPSY Left 06/18/2023    MM LT BREAST BX W LOC DEV 1ST LESION IMAGE BX SPEC STEREO GUIDE 06/18/2023 [facility]   COLONOSCOPY   2015                Current Outpatient Medications   Medication Sig Dispense Refill   Cholecalciferol (VITAMIN D -3) 5000 units TABS Take 1 capsule by mouth daily.       lisinopril  (ZESTRIL ) 10 MG tablet TAKE 1 TABLET BY MOUTH EVERY DAY 90 tablet 1   Multiple Vitamin (MULTIVITAMIN) capsule Take 1 capsule by mouth daily.       Probiotic Product (PROBIOTIC PO) Take 1 capsule by mouth every morning.       amoxicillin -clavulanate (AUGMENTIN ) 875-125 MG tablet Take 1 tablet by mouth 2 (two) times daily. (Patient not taking: Reported on 11/28/2023) 14 tablet 0   IMVEXXY MAINTENANCE PACK 10 MCG INST SMARTSIG:1 Insert Vaginal Twice a Week (Patient not taking: Reported on 11/28/2023)       predniSONE  (DELTASONE ) 20 MG tablet Take 1 tablet (20 mg total) by mouth daily with breakfast. (Patient not taking: Reported on 11/28/2023) 5 tablet 0   scopolamine  (TRANSDERM-SCOP) 1 MG/3DAYS Place 1 patch (1.5 mg total) onto the skin every 3 (three) days. (Patient not taking: Reported on 11/28/2023) 5 patch 0      No current facility-administered medications for this visit.             Allergies as of 11/28/2023 - Review Complete 11/28/2023  Allergen Reaction Noted   Amlodipine    12/12/2018  Family History  Problem Relation Age of Onset   Cancer Mother 94        Breast twice recurred at 46/colon/liver   Diabetes Mother     Hypertension Mother     Colon cancer Mother 56   Hypertension Father     Heart disease Brother     Colon polyps Brother     Cancer Maternal Grandfather          possible colon cancer unsure of exact location gastro/tn   Stomach cancer Maternal Grandmother     Esophageal cancer Neg Hx     Rectal cancer Neg Hx            Social History         Socioeconomic History   Marital status: Married      Spouse name: Not on file   Number of children: Not on file   Years of education: Not on file   Highest education level: Not on file  Occupational History   Not on file  Tobacco Use   Smoking status: Never   Smokeless tobacco:  Never  Vaping Use   Vaping status: Never Used  Substance and Sexual Activity   Alcohol use: Yes      Alcohol/week: 2.0 standard drinks of alcohol      Types: 2 Glasses of wine per week      Comment: occ   Drug use: No   Sexual activity: Yes      Partners: Male      Birth control/protection: Post-menopausal  Other Topics Concern   Not on file  Social History Narrative   Not on file    Social Drivers of Health    Financial Resource Strain: Not on file  Food Insecurity: Not on file  Transportation Needs: Not on file  Physical Activity: Not on file  Stress: Not on file  Social Connections: Not on file  Intimate Partner Violence: Not on file      Review of Systems:    Constitutional: No weight loss, fever, chills, weakness or fatigue HEENT: Eyes: No change in vision               Ears, Nose, Throat:  No change in hearing or congestion Skin: No rash or itching Cardiovascular: No chest pain, chest pressure or palpitations   Respiratory: No SOB or cough Gastrointestinal: See HPI and otherwise negative Genitourinary: No dysuria or change in urinary frequency Neurological: No headache, dizziness or syncope Musculoskeletal: No new muscle or joint pain Hematologic: No bleeding or bruising Psychiatric: No history of depression or anxiety      Physical Exam:  Vital signs: BP 120/74   Pulse 78   Ht 5' 7 (1.702 m)   Wt 187 lb 6 oz (85 kg)   BMI 29.35 kg/m    Constitutional: NAD, alert and cooperative Head:  Normocephalic and atraumatic. Eyes:   PEERL, EOMI. No icterus. Conjunctiva pink. Respiratory: Respirations even and unlabored. Lungs clear to auscultation bilaterally.   No wheezes, crackles, or rhonchi.  Cardiovascular:  Regular rate and rhythm. No peripheral edema, cyanosis or pallor.  Gastrointestinal:  Soft, nondistended, nontender. No rebound or guarding. Normal bowel sounds. No appreciable masses or hepatomegaly. Rectal:  Declines Msk:  Symmetrical without gross  deformities. Without edema, no deformity or joint abnormality.  Neurologic:  Alert and  oriented x4;  grossly normal neurologically.  Skin:   Dry and intact without significant lesions or rashes. Psychiatric: Oriented to person, place and time. Demonstrates  good judgement and reason without abnormal affect or behaviors.   RELEVANT LABS AND IMAGING: CBC Labs (Brief)          Component Value Date/Time    WBC 5.5 10/31/2022 0942    RBC 4.79 10/31/2022 0942    HGB 14.0 10/31/2022 0942    HCT 42.3 10/31/2022 0942    PLT 244.0 10/31/2022 0942    MCV 88.4 10/31/2022 0942    MCH 26.7 06/06/2012 1532    MCHC 33.2 10/31/2022 0942    RDW 13.4 10/31/2022 0942    LYMPHSABS 1.3 10/31/2022 0942    MONOABS 0.4 10/31/2022 0942    EOSABS 0.3 10/31/2022 0942    BASOSABS 0.1 10/31/2022 0942        CMP     Labs (Brief)          Component Value Date/Time    NA 141 10/31/2022 0942    K 3.9 10/31/2022 0942    CL 104 10/31/2022 0942    CO2 28 10/31/2022 0942    GLUCOSE 87 10/31/2022 0942    BUN 19 10/31/2022 0942    CREATININE 0.84 10/31/2022 0942    CREATININE 0.77 06/06/2012 1532    CALCIUM 9.5 10/31/2022 0942    PROT 6.7 10/31/2022 0942    ALBUMIN 4.5 10/31/2022 0942    AST 18 10/31/2022 0942    ALT 16 10/31/2022 0942    ALKPHOS 80 10/31/2022 0942    BILITOT 0.8 10/31/2022 0942    GFRNONAA 91.81 03/14/2010 1158          Assessment/Plan:    Dysphagia Solid food dysphagia with episodes of food impaction resulting in excess saliva and regurgitation.  No nausea, vomiting, GERD.  No previous EGD.  No NSAIDs.  DDx includes esophagitis, stricture, mass, dysmotility, spasm. - EGD with possible dilation for further evaluation - I thoroughly discussed the procedure with the patient (at bedside) to include nature of the procedure, alternatives, benefits, and risks (including but not limited to bleeding, infection, perforation, anesthesia/cardiac pulmonary complications).  Patient verbalized  understanding and gave verbal consent to proceed with procedure. - If EGD is unrevealing consider barium swallow - PPI after EGD   History of colon polyps Family history of colon cancer in mother Colonoscopy 05/2020 with sessile serrated polyp without dysplasia.  Due for repeat 05/2025 - Colonoscopy repeat 05/2025         [facility] 11/28/2023, 3:12 PM   Cc: Tower, Laine LABOR, MD  Recent H&P as above.  No interval change.  Now for upper endoscopy with probable esophageal dilation.

## 2024-01-30 NOTE — Patient Instructions (Signed)
 See post dilation diet handout, resume regular diet tomorrow  Continue present medications  Use Pantoprazole  40mg  daily  Follow up with Dr. Abran in 6-8 weeks YOU HAD AN ENDOSCOPIC PROCEDURE TODAY AT THE [facility]:   Refer to the procedure report that was given to you for any specific questions about what was found during the examination.  If the procedure report does not answer your questions, please call your gastroenterologist to clarify.  If you requested that your care partner not be given the details of your procedure findings, then the procedure report has been included in a sealed envelope for you to review at your convenience later.  YOU SHOULD EXPECT: Some feelings of bloating in the abdomen. Passage of more gas than usual.  Walking can help get rid of the air that was put into your GI tract during the procedure and reduce the bloating. If you had a lower endoscopy (such as a colonoscopy or flexible sigmoidoscopy) you may notice spotting of blood in your stool or on the toilet paper. If you underwent a bowel prep for your procedure, you may not have a normal bowel movement for a few days.  Please Note:  You might notice some irritation and congestion in your nose or some drainage.  This is from the oxygen used during your procedure.  There is no need for concern and it should clear up in a day or so.  SYMPTOMS TO REPORT IMMEDIATELY: Following upper endoscopy (EGD)  Vomiting of blood or coffee ground material  New chest pain or pain under the shoulder blades  Painful or persistently difficult swallowing  New shortness of breath  Fever of 100F or higher  Black, tarry-looking stools  For urgent or emergent issues, a gastroenterologist can be reached at any hour by calling [phone]. Do not use MyChart messaging for urgent concerns.   DIET:  We do recommend a small meal at first, but then you may proceed to your regular diet.  Drink plenty of fluids but you should  avoid alcoholic beverages for 24 hours.  ACTIVITY:  You should plan to take it easy for the rest of today and you should NOT DRIVE or use heavy machinery until tomorrow (because of the sedation medicines used during the test).    FOLLOW UP: Our staff will call the number listed on your records the next business day following your procedure.  We will call around 7:15- 8:00 am to check on you and address any questions or concerns that you may have regarding the information given to you following your procedure. If we do not reach you, we will leave a message.     If any biopsies were taken you will be contacted by phone or by letter within the next 1-3 weeks.  Please call us  at [phone] if you have not heard about the biopsies in 3 weeks.   SIGNATURES/CONFIDENTIALITY: You and/or your care partner have signed paperwork which will be entered into your electronic medical record.  These signatures attest to the fact that that the information above on your After Visit Summary has been reviewed and is understood.  Full responsibility of the confidentiality of this discharge information lies with you and/or your care-partner.

## 2024-01-31 ENCOUNTER — Telehealth: Payer: Self-pay

## 2024-01-31 NOTE — Telephone Encounter (Signed)
 LMOM

## 2024-02-05 ENCOUNTER — Ambulatory Visit: Payer: Self-pay | Admitting: Internal Medicine

## 2024-02-05 LAB — SURGICAL PATHOLOGY

## 2024-02-11 ENCOUNTER — Encounter: Payer: Self-pay | Admitting: Family Medicine

## 2024-02-11 ENCOUNTER — Ambulatory Visit (INDEPENDENT_AMBULATORY_CARE_PROVIDER_SITE_OTHER): Admitting: Family Medicine

## 2024-02-11 ENCOUNTER — Ambulatory Visit: Payer: Self-pay | Admitting: Family Medicine

## 2024-02-11 VITALS — BP 121/80 | HR 79 | Temp 98.0°F | Ht 66.75 in | Wt 182.0 lb

## 2024-02-11 DIAGNOSIS — Z1211 Encounter for screening for malignant neoplasm of colon: Secondary | ICD-10-CM

## 2024-02-11 DIAGNOSIS — E559 Vitamin D deficiency, unspecified: Secondary | ICD-10-CM | POA: Diagnosis not present

## 2024-02-11 DIAGNOSIS — K769 Liver disease, unspecified: Secondary | ICD-10-CM

## 2024-02-11 DIAGNOSIS — Z0000 Encounter for general adult medical examination without abnormal findings: Secondary | ICD-10-CM

## 2024-02-11 DIAGNOSIS — R430 Anosmia: Secondary | ICD-10-CM

## 2024-02-11 DIAGNOSIS — R739 Hyperglycemia, unspecified: Secondary | ICD-10-CM

## 2024-02-11 DIAGNOSIS — M85859 Other specified disorders of bone density and structure, unspecified thigh: Secondary | ICD-10-CM

## 2024-02-11 DIAGNOSIS — Z79899 Other long term (current) drug therapy: Secondary | ICD-10-CM

## 2024-02-11 DIAGNOSIS — I10 Essential (primary) hypertension: Secondary | ICD-10-CM | POA: Diagnosis not present

## 2024-02-11 DIAGNOSIS — R7989 Other specified abnormal findings of blood chemistry: Secondary | ICD-10-CM

## 2024-02-11 DIAGNOSIS — Z803 Family history of malignant neoplasm of breast: Secondary | ICD-10-CM

## 2024-02-11 LAB — TSH: TSH: 4.3 u[IU]/mL (ref 0.35–5.50)

## 2024-02-11 LAB — T4, FREE: Free T4: 0.83 ng/dL (ref 0.60–1.60)

## 2024-02-11 MED ORDER — LISINOPRIL 10 MG PO TABS: 10.0000 mg | ORAL_TABLET | Freq: Every day | ORAL | 3 refills | Status: AC

## 2024-02-11 NOTE — Progress Notes (Signed)
 Subjective:    Patient ID: Deborah Orozco, female    DOB: 01/17/1956, 68 y.o.   MRN: [id number]  HPI  Here for health maintenance exam and to review chronic medical problems   Wt Readings from Last 3 Encounters:  02/11/24 182 lb (82.6 kg)  01/30/24 187 lb (84.8 kg)  11/28/23 187 lb 6 oz (85 kg)   28.72 kg/m  Vitals:   02/11/24 1127 02/11/24 1206  BP: (!) 128/90 121/80  Pulse: 79   Temp: 98 F (36.7 C)   SpO2: 96%     Immunization History  Administered Date(s) Administered   Influenza Inj Mdck Quad Pf 06/21/2021, 07/08/2022   Influenza,inj,Quad PF,6+ Mos 06/04/2013, 07/13/2014, 07/05/2015, 08/28/2016, 04/01/2017, 04/24/2018, 04/10/2019, 06/27/2020   PFIZER(Purple Top)SARS-COV-2 Vaccination 08/30/2019, 09/23/2019, 06/06/2020   Pfizer Covid-19 Vaccine Bivalent Booster 36yrs & up 06/21/2021   Td 03/14/2010   Tdap 02/28/2018   Zoster Recombinant(Shingrix) 10/16/2020, 01/13/2021    Health Maintenance Due  Topic Date Due   Hepatitis C Screening  Never done   Pneumococcal Vaccine: 50+ Years (1 of 1 - PCV) Never done    Mammogram 01/2024  Mother had breast cancer  Pt gets mri as well/ with contrast  Self breast exam  Incidental liver finding 4.8 cm- cyst ?  Got larger  ? Follow up recommendation   Gyn health-sees gyn    Colon cancer screening - colonoscopy 05/2020 with 5 y recall  Has family history of colon cancer   Bone health  Dexa 03/2022 -osteopenia (at gyn) Falls- none  Fractures-none  Supplements  Last vitamin D  Lab Results  Component Value Date   VD25OH 63.71 12/19/2023    Exercise :  Walking  Started back to classes at the gym  Strength training also      Mood    02/11/2024   11:33 AM 03/01/2023    2:23 PM 11/15/2022    9:14 AM 07/25/2021   11:32 AM 06/27/2020    9:34 AM  Depression screen PHQ 2/9  Decreased Interest 0 0 0 0 0  Down, Depressed, Hopeless 0 0 0 0 0  PHQ - 2 Score 0 0 0 0 0  Altered sleeping 0 0 0 1 1  Tired, decreased  energy 0 0 1 1 1   Change in appetite 0 0 2 0 0  Feeling bad or failure about yourself  0 0 0 0 0  Trouble concentrating 0 0 0 0 0  Moving slowly or fidgety/restless 0 0 0 0 0  Suicidal thoughts 0 0 0 0 0  PHQ-9 Score 0 0 3 2 2   Difficult doing work/chores Not difficult at all Not difficult at all Not difficult at all Not difficult at all Not difficult at all   Father had dementia - alz  Died at 89     HTN bp is stable today  No cp or palpitations or headaches or edema  No side effects to medicines  BP Readings from Last 3 Encounters:  02/11/24 121/80  01/30/24 133/82  11/28/23 120/74   Lisinopril  10 mg daily     Lab Results  Component Value Date   NA 142 12/19/2023   K 4.2 12/19/2023   CO2 28 12/19/2023   GLUCOSE 94 12/19/2023   BUN 20 12/19/2023   CREATININE 0.86 12/19/2023   CALCIUM 9.2 12/19/2023   GFR 70.63 12/19/2023   GFRNONAA 91.81 03/14/2010   GERD Had esophageal stricture - was dilated and improved  No heartburn but put on protonix   Protonix  40 mg daily  Lab Results  Component Value Date   VITAMINB12 795 03/21/2017    Glucose Lab Results  Component Value Date   HGBA1C 5.2 10/31/2022   HGBA1C 5.2 07/25/2021   HGBA1C 5.1 06/23/2020    Saw Dr Blair for loss of smell Had another scope  Had brain MRI-negative -reassuring   Cholesterol Lab Results  Component Value Date   CHOL 181 12/19/2023   CHOL 206 (H) 10/31/2022   CHOL 217 (H) 07/25/2021   Lab Results  Component Value Date   HDL 71.80 12/19/2023   HDL 79.70 10/31/2022   HDL 83.20 07/25/2021   Lab Results  Component Value Date   LDLCALC 93 12/19/2023   LDLCALC 109 (H) 10/31/2022   LDLCALC 112 (H) 07/25/2021   Lab Results  Component Value Date   TRIG 80.0 12/19/2023   TRIG 87.0 10/31/2022   TRIG 111.0 07/25/2021   Lab Results  Component Value Date   CHOLHDL 3 12/19/2023   CHOLHDL 3 10/31/2022   CHOLHDL 3 07/25/2021   Lab Results  Component Value Date   LDLDIRECT 133.1  03/14/2010   Trying to eat healthy  Less processed food and junk food   Lab Results  Component Value Date   ALT 14 12/19/2023   AST 15 12/19/2023   ALKPHOS 77 12/19/2023   BILITOT 0.4 12/19/2023   Lab Results  Component Value Date   WBC 5.2 12/19/2023   HGB 13.0 12/19/2023   HCT 38.0 12/19/2023   MCV 86.6 12/19/2023   PLT 228.0 12/19/2023    Lab Results  Component Value Date   TSH 7.32 (H) 12/19/2023        Patient Active Problem List   Diagnosis Date Noted   Current use of proton pump inhibitor 02/11/2024   Liver lesion 02/11/2024   Elevated TSH 02/11/2024   History of motion sickness 10/09/2023   Esophageal dysphagia 08/22/2023   Congestion of nasal sinus 03/18/2023   Family history of breast cancer in mother 11/15/2022   Sebaceous cyst 09/24/2020   High myopia, both eyes 06/04/2019   Hypertension, essential 08/20/2018   Dysthymia 03/03/2018   Elevated random blood glucose level 02/28/2018   Fatigue 04/01/2017   Loss of smell 07/05/2015   Osteopenia 07/05/2015   Family history of colon cancer 11/20/2013   Nuclear sclerotic cataract of both eyes 04/01/2013   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   Colon cancer screening 07/09/2012   Breast screening 04/08/2012   Routine general medical examination at a health care facility 12/20/2011   Vitamin D  deficiency 03/14/2010   Past Medical History:  Diagnosis Date   Hypertension    Post-menopausal    Sleep apnea    use cpap   Past Surgical History:  Procedure Laterality Date   BREAST BIOPSY Left 06/18/2023   MM LT BREAST BX W LOC DEV 1ST LESION IMAGE BX SPEC STEREO GUIDE 06/18/2023 [facility]   COLONOSCOPY  2015   WRIST SURGERY Left 2022   Social History   Tobacco Use   Smoking status: Never   Smokeless tobacco: Never  Vaping Use   Vaping status: Never Used  Substance Use Topics   Alcohol use: Yes    Alcohol/week: 2.0 standard drinks of alcohol    Types: 2 Glasses of wine per week     Comment: occ   Drug use: No   Family History  Problem Relation Age of Onset   Cancer Mother 38       Breast twice  recurred at 46/colon/liver   Diabetes Mother    Hypertension Mother    Colon cancer Mother 71   Hypertension Father    Heart disease Brother    Colon polyps Brother    Cancer Maternal Grandfather        possible colon cancer unsure of exact location gastro/tn   Stomach cancer Maternal Grandmother    Esophageal cancer Neg Hx    Rectal cancer Neg Hx    Allergies  Allergen Reactions   Amlodipine  Other (See Comments)    dizzy   Current Outpatient Medications on File Prior to Visit  Medication Sig Dispense Refill   Cholecalciferol (VITAMIN D -3) 5000 units TABS Take 1 capsule by mouth daily.     IMVEXXY MAINTENANCE PACK 10 MCG INST SMARTSIG:1 Insert Vaginal Twice a Week     Multiple Vitamin (MULTIVITAMIN) capsule Take 1 capsule by mouth daily.     pantoprazole  (PROTONIX ) 40 MG tablet Take 1 tablet (40 mg total) by mouth daily. 30 tablet 11   Probiotic Product (PROBIOTIC PO) Take 1 capsule by mouth every morning.     scopolamine  (TRANSDERM-SCOP) 1 MG/3DAYS Place 1 patch (1.5 mg total) onto the skin every 3 (three) days. 5 patch 0   No current facility-administered medications on file prior to visit.    Review of Systems  Constitutional:  Negative for activity change, appetite change, fatigue, fever and unexpected weight change.  HENT:  Negative for congestion, ear pain, rhinorrhea, sinus pressure and sore throat.   Eyes:  Negative for pain, redness and visual disturbance.  Respiratory:  Negative for cough, shortness of breath and wheezing.   Cardiovascular:  Negative for chest pain and palpitations.  Gastrointestinal:  Negative for abdominal pain, blood in stool, constipation and diarrhea.  Endocrine: Negative for polydipsia and polyuria.  Genitourinary:  Negative for dysuria, frequency and urgency.  Musculoskeletal:  Negative for arthralgias, back pain and myalgias.   Skin:  Negative for pallor and rash.  Allergic/Immunologic: Negative for environmental allergies.  Neurological:  Negative for dizziness, syncope and headaches.  Hematological:  Negative for adenopathy. Does not bruise/bleed easily.  Psychiatric/Behavioral:  Negative for decreased concentration and dysphoric mood. The patient is not nervous/anxious.        Objective:   Physical Exam Constitutional:      General: She is not in acute distress.    Appearance: Normal appearance. She is well-developed and normal weight. She is not ill-appearing or diaphoretic.  HENT:     Head: Normocephalic and atraumatic.     Right Ear: Tympanic membrane, ear canal and external ear normal.     Left Ear: Tympanic membrane, ear canal and external ear normal.     Nose: Nose normal. No congestion.     Mouth/Throat:     Mouth: Mucous membranes are moist.     Pharynx: Oropharynx is clear. No posterior oropharyngeal erythema.  Eyes:     General: No scleral icterus.    Extraocular Movements: Extraocular movements intact.     Conjunctiva/sclera: Conjunctivae normal.     Pupils: Pupils are equal, round, and reactive to light.  Neck:     Thyroid : No thyromegaly.     Vascular: No carotid bruit or JVD.  Cardiovascular:     Rate and Rhythm: Normal rate and regular rhythm.     Pulses: Normal pulses.     Heart sounds: Normal heart sounds.     No gallop.  Pulmonary:     Effort: Pulmonary effort is normal. No respiratory distress.  Breath sounds: Normal breath sounds. No wheezing.     Comments: Good air exch Chest:     Chest wall: No tenderness.  Abdominal:     General: Bowel sounds are normal. There is no distension or abdominal bruit.     Palpations: Abdomen is soft. There is no mass.     Tenderness: There is no abdominal tenderness.     Hernia: No hernia is present.  Genitourinary:    Comments: Breast and pelvic exam are done by gyn provider   Musculoskeletal:        General: No tenderness. Normal  range of motion.     Cervical back: Normal range of motion and neck supple. No rigidity. No muscular tenderness.     Right lower leg: No edema.     Left lower leg: No edema.     Comments: No kyphosis   Lymphadenopathy:     Cervical: No cervical adenopathy.  Skin:    General: Skin is warm and dry.     Coloration: Skin is not pale.     Findings: No erythema or rash.     Comments: Solar lentigines diffusely   Neurological:     Mental Status: She is alert. Mental status is at baseline.     Cranial Nerves: No cranial nerve deficit.     Motor: No abnormal muscle tone.     Coordination: Coordination normal.     Gait: Gait normal.     Deep Tendon Reflexes: Reflexes are normal and symmetric. Reflexes normal.  Psychiatric:        Mood and Affect: Mood normal.        Cognition and Memory: Cognition and memory normal.           Assessment & Plan:   Problem List Items Addressed This Visit       Cardiovascular and Mediastinum   Hypertension, essential   bp in fair control at this time  BP Readings from Last 1 Encounters:  02/11/24 121/80   No changes needed Most recent labs reviewed  Disc lifstyle change with low sodium diet and exercise  Plan to continue lisinopril  10 mg daily      Relevant Medications   lisinopril  (ZESTRIL ) 10 MG tablet     Digestive   Liver lesion   Mri noted incidental liver finding 4.8 cm  Consistent with cyst but bigger than prior No symptoms         Musculoskeletal and Integument   Osteopenia   Will likely get dexa at gyn this fall  Last 03/2022 No falls or fracture Normal D level  Good habits Discussed fall prevention, supplements and exercise for bone density          Other   Vitamin D  deficiency   Routine general medical examination at a health care facility - Primary   Reviewed health habits including diet and exercise and skin cancer prevention Reviewed appropriate screening tests for age  Also reviewed health mt list, fam hx  and immunization status , as well as social and family history   See HPI Labs reviewed and ordered Health Maintenance  Topic Date Due   Hepatitis C Screening  Never done   Pneumococcal Vaccine for age over 63 (1 of 1 - PCV) Never done   COVID-19 Vaccine (5 - 2024-25 season) 09/06/2024*   Flu Shot  02/21/2024   Colon Cancer Screening  05/23/2025   Mammogram  01/28/2026   DTaP/Tdap/Td vaccine (3 - Td or Tdap) 02/29/2028  DEXA scan (bone density measurement)  Completed   Zoster (Shingles) Vaccine  Completed   Hepatitis B Vaccine  Aged Out   HPV Vaccine  Aged Out   Meningitis B Vaccine  Aged Out  *Topic was postponed. The date shown is not the original due date.    Consider pna vaccine  Utd breast screening and colon screening  Will continue gyn visits  Discussed fall prevention, supplements and exercise for bone density  -will be due for dexa in the fall at gyn PHQ 0       Family history of breast cancer in mother   Continues mammograms and MRI       Elevated TSH   TSH 7.32 TSH repeat today with FT4 No clinical changes      Relevant Orders   TSH   T4, free   Elevated random blood glucose level   Glucose 94 Good diet and exercise now       Current use of proton pump inhibitor   Started on protonix  40 mg daily by GI Had esoph stricture  Will check B12 with next labs if still on it      Colon cancer screening   Colonoscopy 05/2020 with 5 y recall Has famhx

## 2024-02-11 NOTE — Patient Instructions (Addendum)
 I will review re: liver lesion and get back to you with a plan   See Dr Abran in sept as planned as well   Keep up the exercise  Especially strength training   More thyroid  tests today

## 2024-02-11 NOTE — Assessment & Plan Note (Signed)
 Will likely get dexa at gyn this fall  Last 03/2022 No falls or fracture Normal D level  Good habits Discussed fall prevention, supplements and exercise for bone density

## 2024-02-11 NOTE — Assessment & Plan Note (Signed)
 Reviewed health habits including diet and exercise and skin cancer prevention Reviewed appropriate screening tests for age  Also reviewed health mt list, fam hx and immunization status , as well as social and family history   See HPI Labs reviewed and ordered Health Maintenance  Topic Date Due   Hepatitis C Screening  Never done   Pneumococcal Vaccine for age over 63 (1 of 1 - PCV) Never done   COVID-19 Vaccine (5 - 2024-25 season) 09/06/2024*   Flu Shot  02/21/2024   Colon Cancer Screening  05/23/2025   Mammogram  01/28/2026   DTaP/Tdap/Td vaccine (3 - Td or Tdap) 02/29/2028   DEXA scan (bone density measurement)  Completed   Zoster (Shingles) Vaccine  Completed   Hepatitis B Vaccine  Aged Out   HPV Vaccine  Aged Out   Meningitis B Vaccine  Aged Out  *Topic was postponed. The date shown is not the original due date.    Consider pna vaccine  Utd breast screening and colon screening  Will continue gyn visits  Discussed fall prevention, supplements and exercise for bone density  -will be due for dexa in the fall at gyn PHQ 0

## 2024-02-11 NOTE — Assessment & Plan Note (Signed)
 Glucose 94 Good diet and exercise now

## 2024-02-11 NOTE — Assessment & Plan Note (Signed)
 Colonoscopy 05/2020 with 5 y recall Has famhx

## 2024-02-11 NOTE — Assessment & Plan Note (Signed)
 bp in fair control at this time  BP Readings from Last 1 Encounters:  02/11/24 121/80   No changes needed Most recent labs reviewed  Disc lifstyle change with low sodium diet and exercise  Plan to continue lisinopril  10 mg daily

## 2024-02-11 NOTE — Assessment & Plan Note (Signed)
 Continues mammograms and MRI

## 2024-02-11 NOTE — Assessment & Plan Note (Signed)
 Did see ENT No cause found MRI brain was also normal

## 2024-02-11 NOTE — Assessment & Plan Note (Addendum)
 Mri noted incidental liver finding 4.8 cm (was 3.3 cm in 2022)  Consistent with cyst but bigger than prior No symptoms  Will reach out to her GI re: recommendation for re imaging or other management

## 2024-02-11 NOTE — Assessment & Plan Note (Signed)
 Started on protonix  40 mg daily by GI Had esoph stricture  Will check B12 with next labs if still on it

## 2024-02-11 NOTE — Assessment & Plan Note (Signed)
 TSH 7.32 TSH repeat today with FT4 No clinical changes

## 2024-02-11 NOTE — Assessment & Plan Note (Signed)
 Vitamin D  level is therapeutic with current supplementation Disc importance of this to bone and overall health  Last vitamin D  Lab Results  Component Value Date   VD25OH 63.71 12/19/2023

## 2024-02-12 ENCOUNTER — Telehealth: Payer: Self-pay

## 2024-02-12 ENCOUNTER — Telehealth: Payer: Self-pay | Admitting: Family Medicine

## 2024-02-12 NOTE — Telephone Encounter (Signed)
 Pt has ov scheduled for 03/30/24@11am .

## 2024-02-12 NOTE — Telephone Encounter (Signed)
 Please let pt know that I touched base with dr Abran about her liver cyst  He does not think it needs to be followed unless she has symptoms   Please let her know   Per note:  Characterized as benign cyst on MRI.  Nothing further needs to be done in this regard.  Thanks,  JP

## 2024-02-12 NOTE — Telephone Encounter (Signed)
-----   Message from Norleen Kiang sent at 02/11/2024  9:27 PM EDT ----- Thanks for reaching out. Benign appearing cyst. These can enlarge over time. Nothing further needs to be done, as she is asymptomatic. I'm happy to discuss with her at her September follow up appointment with me. JP ----- Message ----- From: Randeen Laine LABOR, MD Sent: 02/11/2024   8:29 PM EDT To: Norleen LOISE Kiang, MD  I wanted to pick your brain about this shared patient  She has an incidental benign appearing liver cyst that showed up on breast MRI in 2022, and recently ,  that grew from 3.3 cm to 4.8 cm She is asymptomatic   Re image? If so in how long?   Thanks for the input in advance   Also thanks for dilating her esophagus, she feels much better!

## 2024-02-13 NOTE — Telephone Encounter (Signed)
 Called patient reviewed all information and repeated back to me. Will call if any questions.  ? ?

## 2024-03-13 ENCOUNTER — Encounter: Payer: Self-pay | Admitting: General Practice

## 2024-03-13 ENCOUNTER — Ambulatory Visit: Admitting: General Practice

## 2024-03-13 ENCOUNTER — Ambulatory Visit: Payer: Self-pay | Admitting: General Practice

## 2024-03-13 VITALS — BP 110/70 | HR 93 | Temp 98.0°F | Ht 66.75 in | Wt 183.0 lb

## 2024-03-13 DIAGNOSIS — J029 Acute pharyngitis, unspecified: Secondary | ICD-10-CM | POA: Diagnosis not present

## 2024-03-13 DIAGNOSIS — T753XXA Motion sickness, initial encounter: Secondary | ICD-10-CM | POA: Diagnosis not present

## 2024-03-13 LAB — POCT RAPID STREP A (OFFICE): Rapid Strep A Screen: NEGATIVE

## 2024-03-13 MED ORDER — SCOPOLAMINE 1 MG/3DAYS TD PT72: 1.0000 | MEDICATED_PATCH | TRANSDERMAL | 0 refills | Status: DC

## 2024-03-13 NOTE — Patient Instructions (Signed)
 Strep negative in office today.   Start warm salt water gargles, stay hydrated, rest and use cool mist humidifier.  Ok to use lozenges.   Follow up when you are back from your trip if not better.   Let me know if you have any questions or concerns.   Patches has been sent for motion sickness.   It was a pleasure meeting you!

## 2024-03-13 NOTE — Assessment & Plan Note (Signed)
 Refill sent for Scopolamine  patch.

## 2024-03-13 NOTE — Progress Notes (Signed)
 Established Patient Office Visit  Subjective   Patient ID: Deborah Orozco, female    DOB: [birth year]  Age: 68 y.o. MRN: [id number]  Chief Complaint  Patient presents with   Sore Throat    Had an endoscopy done where they stretched out her throat 5 weeks ago and throats been sore off and on. Patient is going on a trip next week and wanted to get looked at. Hurts to swallow and was woken up last night by this.   Medication Refill    Would like patch refilled if possible for her trip    Sore Throat  Pertinent negatives include no abdominal pain, congestion, diarrhea, ear pain, headaches, shortness of breath or vomiting.  Medication Refill Associated symptoms include a sore throat. Pertinent negatives include no abdominal pain, chest pain, chills, congestion, fever, headaches, nausea or vomiting.    Deborah Orozco is a 68 year old female, patient of Dr. Randeen, presents today for an acute visit.   Discussed the use of AI scribe software for clinical note transcription with the patient, who gave verbal consent to proceed.  History of Present Illness Deborah Orozco is a 68 year old female who presents with a sore throat and difficulty swallowing.  She has been experiencing a sore throat and difficulty swallowing for the past two days. The pain is described as sharp, 'like knives,' and is painful rather than constricted.   She had a similar episode of sore throat about five weeks ago following an endoscopy, which resolved after ten days without additional symptoms.  No ear pain, sinus pain, congestion, fever, chills, fatigue, chest pain, shortness of breath, heartburn, nausea, vomiting, abdominal pain, diarrhea, constipation, urinary symptoms, dizziness, or headache.  She has been using cough drops to soothe her throat.  She is going on a cruise and would like a refill of her Scopolamine  patch for motion sickness.     Patient Active Problem List   Diagnosis Date Noted   Motion  sickness 03/13/2024   Sorethroat 03/13/2024   Current use of proton pump inhibitor 02/11/2024   Liver lesion 02/11/2024   Elevated TSH 02/11/2024   History of motion sickness 10/09/2023   Esophageal dysphagia 08/22/2023   Congestion of nasal sinus 03/18/2023   Family history of breast cancer in mother 11/15/2022   Sebaceous cyst 09/24/2020   High myopia, both eyes 06/04/2019   Hypertension, essential 08/20/2018   Dysthymia 03/03/2018   Elevated random blood glucose level 02/28/2018   Fatigue 04/01/2017   Loss of smell 07/05/2015   Osteopenia 07/05/2015   Family history of colon cancer 11/20/2013   Nuclear sclerotic cataract of both eyes 04/01/2013   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   Colon cancer screening 07/09/2012   Breast screening 04/08/2012   Routine general medical examination at a health care facility 12/20/2011   Vitamin D  deficiency 03/14/2010   Past Medical History:  Diagnosis Date   Hypertension    Post-menopausal    Sleep apnea    use cpap   Past Surgical History:  Procedure Laterality Date   BREAST BIOPSY Left 06/18/2023   MM LT BREAST BX W LOC DEV 1ST LESION IMAGE BX SPEC STEREO GUIDE 06/18/2023 [facility]   COLONOSCOPY  2015   WRIST SURGERY Left 2022   Allergies  Allergen Reactions   Amlodipine  Other (See Comments)    dizzy         03/13/2024    3:49 PM 02/11/2024   11:33 AM 03/01/2023    2:23 PM  Depression screen PHQ 2/9  Decreased Interest 0 0 0  Down, Depressed, Hopeless 0 0 0  PHQ - 2 Score 0 0 0  Altered sleeping 0 0 0  Tired, decreased energy 0 0 0  Change in appetite 0 0 0  Feeling bad or failure about yourself  0 0 0  Trouble concentrating 0 0 0  Moving slowly or fidgety/restless 0 0 0  Suicidal thoughts 0 0 0  PHQ-9 Score 0 0 0  Difficult doing work/chores Not difficult at all Not difficult at all Not difficult at all       02/11/2024   11:33 AM 03/01/2023    2:25 PM 11/15/2022    9:14 AM  GAD 7 : Generalized  Anxiety Score  Nervous, Anxious, on Edge 1 0 0  Control/stop worrying 1 0 1  Worry too much - different things 0 0 1  Trouble relaxing 0 0 0  Restless 0 0 0  Easily annoyed or irritable 0 0 0  Afraid - awful might happen 0 0 0  Total GAD 7 Score 2 0 2  Anxiety Difficulty Not difficult at all Not difficult at all Not difficult at all      Review of Systems  Constitutional:  Positive for malaise/fatigue. Negative for chills and fever.  HENT:  Positive for sore throat. Negative for congestion, ear pain and sinus pain.   Respiratory:  Negative for shortness of breath.   Cardiovascular:  Negative for chest pain.  Gastrointestinal:  Negative for abdominal pain, constipation, diarrhea, heartburn, nausea and vomiting.  Genitourinary:  Negative for dysuria, frequency and urgency.  Neurological:  Negative for dizziness and headaches.  Endo/Heme/Allergies:  Negative for polydipsia.  Psychiatric/Behavioral:  Negative for depression and suicidal ideas. The patient is not nervous/anxious.       Objective:     BP 110/70   Pulse 93   Temp 98 F (36.7 C) (Temporal)   Ht 5' 6.75 (1.695 m)   Wt 183 lb (83 kg)   SpO2 95%   BMI 28.88 kg/m  BP Readings from Last 3 Encounters:  03/13/24 110/70  02/11/24 121/80  01/30/24 133/82   Wt Readings from Last 3 Encounters:  03/13/24 183 lb (83 kg)  02/11/24 182 lb (82.6 kg)  01/30/24 187 lb (84.8 kg)      Physical Exam Vitals and nursing note reviewed.  Constitutional:      Appearance: Normal appearance.  Cardiovascular:     Rate and Rhythm: Normal rate and regular rhythm.     Pulses: Normal pulses.     Heart sounds: Normal heart sounds.  Pulmonary:     Effort: Pulmonary effort is normal.     Breath sounds: Normal breath sounds.  Neurological:     Mental Status: She is alert and oriented to person, place, and time.  Psychiatric:        Mood and Affect: Mood normal.        Behavior: Behavior normal.        Thought Content: Thought  content normal.        Judgment: Judgment normal.      Results for orders placed or performed in visit on 03/13/24  POCT rapid strep A  Result Value Ref Range   Rapid Strep A Screen Negative Negative       The 10-year ASCVD risk score (Arnett DK, et al., 2019) is: 5.3%    Assessment & Plan:  Sorethroat -     POCT rapid strep A  Motion sickness, initial encounter Assessment & Plan: Refill sent for Scopolamine  patch.   Orders: -     Scopolamine ; Place 1 patch (1 mg total) onto the skin every 3 (three) days.  Dispense: 5 patch; Refill: 0    Assessment and Plan Assessment & Plan Acute pharyngitis Acute pharyngitis likely due to irritation or dryness. Negative strep test. - Recommend warm salt water gargles. - Advise staying hydrated. - Use lozenges or hard candy to soothe the throat. - Use a cool mist humidifier to maintain moist air. - Follow up if symptoms worsen or do not improve after returning from the cruise. - Reach out via MyChart for questions or concerns.   Return if symptoms worsen or fail to improve.    Carrol Aurora, NP

## 2024-03-30 ENCOUNTER — Ambulatory Visit: Admitting: Internal Medicine

## 2024-05-13 ENCOUNTER — Telehealth: Payer: Self-pay | Admitting: Internal Medicine

## 2024-05-13 NOTE — Telephone Encounter (Signed)
 Patient calling in regards to pantoprazole  medication. States pharmacy states they cannot refill medication until 2026? Please advise.   Thank you

## 2024-05-14 ENCOUNTER — Telehealth: Payer: Self-pay

## 2024-05-14 ENCOUNTER — Other Ambulatory Visit (HOSPITAL_COMMUNITY): Payer: Self-pay

## 2024-05-14 NOTE — Telephone Encounter (Signed)
 Pharmacy Patient Advocate Encounter   Received notification from Pt Calls Messages that prior authorization for Pantoprazole  Sodium 40MG  dr tablets is required/requested.   Insurance verification completed.   The patient is insured through [facility].   Per test claim: PA required; PA submitted to above mentioned insurance via Latent Key/confirmation #/EOC [id number] Status is pending

## 2024-05-14 NOTE — Telephone Encounter (Signed)
 Spoke with pharmacy - Pantoprazole  needs a prior authorization to be refilled.  Please initiate.  Thanks!

## 2024-05-14 NOTE — Telephone Encounter (Signed)
 Spoke to patient and explained that the hold up with her Pantoprazole  was just that it needed a PA.  I will contact her once it is approved and I resend it to the pharmacy.  Patient agreed

## 2024-05-14 NOTE — Telephone Encounter (Signed)
 Pharmacy Patient Advocate Encounter  Received notification from [facility] that Prior Authorization for Pantoprazole  Sodium 40MG  dr tablets has been APPROVED from 05-14-2024 to 05-14-2025   PA #/Case ID/Reference #: [id number]

## 2024-05-14 NOTE — Telephone Encounter (Signed)
 PA request has been Submitted. New Encounter has been or will be created for follow up. For additional info see Pharmacy Prior Auth telephone encounter from 05-14-2024.

## 2024-05-18 ENCOUNTER — Telehealth: Payer: Self-pay | Admitting: Internal Medicine

## 2024-05-18 MED ORDER — PANTOPRAZOLE SODIUM 40 MG PO TBEC: 40.0000 mg | DELAYED_RELEASE_TABLET | Freq: Every day | ORAL | 3 refills | Status: DC

## 2024-05-18 NOTE — Telephone Encounter (Signed)
 Hello Dr. Abran  PT is inquiring about medication previously discussed with you.   Thanks, Emelia.

## 2024-05-18 NOTE — Telephone Encounter (Signed)
 Sonny I think she is referring to her protonix .

## 2024-05-18 NOTE — Telephone Encounter (Signed)
 Prior authorization for Protonix  was approved.  Resent rx to pharmacy with this information

## 2024-05-18 NOTE — Telephone Encounter (Signed)
 PA approved. Rx resent

## 2024-05-22 ENCOUNTER — Ambulatory Visit: Admitting: Physician Assistant

## 2024-07-01 NOTE — Progress Notes (Signed)
 Ellouise Console, PA-C [address] [place], [place]  [address] Phone: [phone]   Primary Care Physician: Tower, Laine LABOR, MD  Primary Gastroenterologist:  Ellouise Console, PA-C / Norleen Kiang, MD   Chief Complaint: Follow-up dysphagia and esophageal stricture   HPI:   Discussed the use of AI scribe software for clinical note transcription with the patient, who gave verbal consent to proceed.  01/2024 EGD by Dr. Kiang: Distal esophageal stricture dilated to 19 mm.  Erosive gastritis.  Biopsies negative for H. pylori.  05/2020 colonoscopy by Dr. Kiang: 6 mm sessile serrated polyp removed.  Pandiverticulosis.  Internal hemorrhoids.  5-year repeat (due 05/2025).  History of Present Illness Deborah Orozco is a 68 year old female with dysphagia who presents for a six-month follow-up.  Dysphagia and esophageal symptoms - No episodes of solid food dysphagia or food impaction since last EGD procedure, except for one recent episode after eating quickly, associated with hiccups. - No heartburn or acid reflux symptoms. - History of benign lower esophageal stricture, previously dilated to 19 mm. - History of reflux esophagitis and hiatal hernia.  She has not had any heartburn symptoms before or after starting PPI medication. - Pantoprazole  40 mg daily was taken for two months, discontinued in September.  Gastric and duodenal findings - History of erosive gastritis. - EGD in July 2025 showed erosive gastritis and hiatal hernia. - Biopsies from EGD negative for H. pylori.  Colorectal cancer screening - Family history of colon cancer in mother. - Colonoscopy in November 2021 revealed a small polyp. - Five-year interval for repeat colonoscopy recommended.  Sinonasal symptoms - Chronic sinus drainage and hyposmia. - Frequent use of Advil Cold and Sinus for symptom relief. - Zyrtec provides minimal relief.  Recent melanoma procedure and analgesic use - Recent procedure for melanoma  on back. - Alternating Advil and Tylenol for pain management post-procedure.     Current Outpatient Medications  Medication Sig Dispense Refill   Cholecalciferol (VITAMIN D -3) 5000 units TABS Take 1 capsule by mouth daily.     IMVEXXY MAINTENANCE PACK 10 MCG INST SMARTSIG:1 Insert Vaginal Twice a Week     lisinopril  (ZESTRIL ) 10 MG tablet Take 1 tablet (10 mg total) by mouth daily. 90 tablet 3   Multiple Vitamin (MULTIVITAMIN) capsule Take 1 capsule by mouth daily.     Probiotic Product (PROBIOTIC PO) Take 1 capsule by mouth every morning.     pantoprazole  (PROTONIX ) 40 MG tablet Take 1 tablet (40 mg total) by mouth daily. 30 tablet 11   No current facility-administered medications for this visit.    Allergies as of 07/02/2024 - Review Complete 07/02/2024  Allergen Reaction Noted   Amlodipine  Other (See Comments) 12/12/2018    Past Medical History:  Diagnosis Date   Hypertension    Post-menopausal    Sleep apnea    use cpap    Past Surgical History:  Procedure Laterality Date   BREAST BIOPSY Left 06/18/2023   MM LT BREAST BX W LOC DEV 1ST LESION IMAGE BX SPEC STEREO GUIDE 06/18/2023 [facility]   COLONOSCOPY  2015   WRIST SURGERY Left 2022    Review of Systems:    All systems reviewed and negative except where noted in HPI.    Physical Exam:  BP 138/88   Pulse 77   Ht 5' 6.75 (1.695 m)   Wt 186 lb 12.8 oz (84.7 kg)   BMI 29.48 kg/m  No LMP recorded. Patient is postmenopausal.  General: Well-nourished, well-developed in no acute distress.  Lungs: Clear to auscultation bilaterally. Non-labored. Heart: Regular rate and rhythm, no murmurs rubs or gallops.  Abdomen: Bowel sounds are normal; Abdomen is Soft; No hepatosplenomegaly, masses or hernias;  No Abdominal Tenderness; No guarding or rebound tenderness. Neuro: Alert and oriented x 3.  Grossly intact.  Psych: Alert and cooperative, normal mood and affect.   Imaging Studies: No results  found.  Labs: CBC    Component Value Date/Time   WBC 5.2 12/19/2023 0821   RBC 4.39 12/19/2023 0821   HGB 13.0 12/19/2023 0821   HCT 38.0 12/19/2023 0821   PLT 228.0 12/19/2023 0821   MCV 86.6 12/19/2023 0821   MCH 26.7 06/06/2012 1532   MCHC 34.1 12/19/2023 0821   RDW 13.4 12/19/2023 0821   LYMPHSABS 1.5 12/19/2023 0821   MONOABS 0.4 12/19/2023 0821   EOSABS 0.4 12/19/2023 0821   BASOSABS 0.1 12/19/2023 0821    CMP     Component Value Date/Time   NA 142 12/19/2023 0821   K 4.2 12/19/2023 0821   CL 109 12/19/2023 0821   CO2 28 12/19/2023 0821   GLUCOSE 94 12/19/2023 0821   BUN 20 12/19/2023 0821   CREATININE 0.86 12/19/2023 0821   CREATININE 0.77 06/06/2012 1532   CALCIUM 9.2 12/19/2023 0821   PROT 6.0 12/19/2023 0821   ALBUMIN 4.3 12/19/2023 0821   AST 15 12/19/2023 0821   ALT 14 12/19/2023 0821   ALKPHOS 77 12/19/2023 0821   BILITOT 0.4 12/19/2023 0821   GFRNONAA 91.81 03/14/2010 1158     Assessment and Plan:   Deborah Orozco is a 68 y.o. y/o female   1.  Distal esophageal stricture dilated to 19 mm during EGD 01/2024.  Dysphagia has resolved.  2.  GERD with hiatal hernia -Continue pantoprazole  40 mg once daily, #30,.11 RF - Recommend Lifestyle Modifications to prevent Acid Reflux.  Rec. Avoid coffee, sodas, peppermint, garlic, onions, alcohol, citrus fruits, chocolate, tomatoes, fatty and spicey foods.  Avoid eating 2-3 hours before bedtime.   - Discussed hiatal hernia repair surgery as a last resort if upper GI symptoms recur or worsen.  3.  Erosive gastritis (biopsies negative for H. pylori) EGD 01/2024 - Avoid NSAIDs such as Advil, ibuprofen, Motrin, Aleve, or naproxen. -She may take Tylenol or acetaminophen if needed for pain. -Continue pantoprazole  40 mg once daily.  4.  History of sessile serrated colon polyp: Last colonoscopy 05/2020 5.  Family history of colon cancer: Mother - 5-year repeat colonoscopy will be due 05/2025. Assessment &  Plan Benign lower esophageal stricture secondary to gastroesophageal reflux disease, with erosive gastritis and hiatal hernia Asymptomatic post-dilation with no dysphagia or heartburn. Discussed silent acid reflux, recurrent stricture risk, and long-term PPI risks including renal, bone, and vitamin concerns. Reviewed hiatal hernia and surgical options if symptoms worsen. - Continue pantoprazole  40 mg daily for 3 months, then reduce to 20 mg daily if asymptomatic. - Avoid NSAIDs such as Advil, ibuprofen, Aleve, and Motrin. - Use Tylenol for pain management, not exceeding 2000 mg per day. - Consider ENT referral for sinus issues and potential allergies. - Scheduled follow-up in one year for medication refill and colonoscopy scheduling.   Ellouise Console, PA-C  Follow up in 1 year to schedule repeat colonoscopy (due 05/2025).

## 2024-07-02 ENCOUNTER — Ambulatory Visit: Admitting: Physician Assistant

## 2024-07-02 ENCOUNTER — Encounter: Payer: Self-pay | Admitting: Physician Assistant

## 2024-07-02 VITALS — BP 138/88 | HR 77 | Ht 66.75 in | Wt 186.8 lb

## 2024-07-02 DIAGNOSIS — K449 Diaphragmatic hernia without obstruction or gangrene: Secondary | ICD-10-CM | POA: Diagnosis not present

## 2024-07-02 DIAGNOSIS — K222 Esophageal obstruction: Secondary | ICD-10-CM

## 2024-07-02 DIAGNOSIS — Z860101 Personal history of adenomatous and serrated colon polyps: Secondary | ICD-10-CM

## 2024-07-02 DIAGNOSIS — K2100 Gastro-esophageal reflux disease with esophagitis, without bleeding: Secondary | ICD-10-CM

## 2024-07-02 DIAGNOSIS — Z800 Family history of malignant neoplasm of digestive organs: Secondary | ICD-10-CM | POA: Diagnosis not present

## 2024-07-02 DIAGNOSIS — K259 Gastric ulcer, unspecified as acute or chronic, without hemorrhage or perforation: Secondary | ICD-10-CM

## 2024-07-02 DIAGNOSIS — Z860100 Personal history of colon polyps, unspecified: Secondary | ICD-10-CM

## 2024-07-02 MED ORDER — PANTOPRAZOLE SODIUM 40 MG PO TBEC: 40.0000 mg | DELAYED_RELEASE_TABLET | Freq: Every day | ORAL | 11 refills | Status: AC

## 2024-07-02 NOTE — Patient Instructions (Addendum)
 We have sent the following medications to your pharmacy for you to pick up at your convenience: Pantoprazole  40 mg once daily  Please follow up sooner if symptoms increase or worsen  Due to recent changes in healthcare laws, you may see the results of your imaging and laboratory studies on MyChart before your provider has had a chance to review them.  We understand that in some cases there may be results that are confusing or concerning to you. Not all laboratory results come back in the same time frame and the provider may be waiting for multiple results in order to interpret others.  Please give us  48 hours in order for your provider to thoroughly review all the results before contacting the office for clarification of your results.   Thank you for trusting me with your gastrointestinal care!   Ellouise Console, PA-C _______________________________________________________  If your blood pressure at your visit was 140/90 or greater, please contact your primary care physician to follow up on this. _______________________________________________________  If you are age 1 or older, your body mass index should be between 23-30. Your Body mass index is 29.48 kg/m. If this is out of the aforementioned range listed, please consider follow up with your Primary Care Provider.  If you are age 15 or younger, your body mass index should be between 19-25. Your Body mass index is 29.48 kg/m. If this is out of the aformentioned range listed, please consider follow up with your Primary Care Provider.  ________________________________________________________  The [facility] providers would like to encourage you to use MYCHART to communicate with providers for non-urgent requests or questions.  Due to long hold times on the telephone, sending your provider a message by [facility] may be a faster and more efficient way to get a response.  Please allow 48 business hours for a response.  Please remember that this is for  non-urgent requests.  _______________________________________________________

## 2024-07-02 NOTE — Progress Notes (Signed)
 Noted

## 2024-08-14 ENCOUNTER — Encounter: Payer: Self-pay | Admitting: Family Medicine
